# Patient Record
Sex: Female | Born: 1955 | Race: White | Hispanic: No | Marital: Married | Smoking: Never smoker
Health system: Southern US, Community
[De-identification: ages and names within clinical notes are randomized; demographics above are authoritative.]

## PROBLEM LIST (undated history)

## (undated) DIAGNOSIS — K5792 Diverticulitis of intestine, part unspecified, without perforation or abscess without bleeding: Secondary | ICD-10-CM

## (undated) DIAGNOSIS — I10 Essential (primary) hypertension: Secondary | ICD-10-CM

## (undated) DIAGNOSIS — T7840XA Allergy, unspecified, initial encounter: Secondary | ICD-10-CM

## (undated) DIAGNOSIS — F32A Depression, unspecified: Secondary | ICD-10-CM

## (undated) DIAGNOSIS — F4310 Post-traumatic stress disorder, unspecified: Secondary | ICD-10-CM

## (undated) HISTORY — DX: Post-traumatic stress disorder, unspecified: F43.10

## (undated) HISTORY — DX: Diverticulitis of intestine, part unspecified, without perforation or abscess without bleeding: K57.92

## (undated) HISTORY — DX: Depression, unspecified: F32.A

## (undated) HISTORY — DX: Allergy, unspecified, initial encounter: T78.40XA

## (undated) HISTORY — PX: PLACEMENT OF BREAST IMPLANTS: SHX6334

## (undated) HISTORY — PX: RHINOPLASTY: SUR1284

---

## 2008-04-19 DIAGNOSIS — E039 Hypothyroidism, unspecified: Secondary | ICD-10-CM | POA: Insufficient documentation

## 2011-11-11 ENCOUNTER — Other Ambulatory Visit (HOSPITAL_COMMUNITY): Payer: Self-pay | Admitting: Chiropractic Medicine

## 2011-11-11 ENCOUNTER — Ambulatory Visit (HOSPITAL_COMMUNITY)
Admission: RE | Admit: 2011-11-11 | Discharge: 2011-11-11 | Disposition: A | Discharge status: Home / Self Care | Payer: BC Managed Care – PPO | Source: Ambulatory Visit | Attending: Chiropractic Medicine | Admitting: Chiropractic Medicine

## 2011-11-11 DIAGNOSIS — M25559 Pain in unspecified hip: Secondary | ICD-10-CM

## 2012-10-03 DIAGNOSIS — Z7189 Other specified counseling: Secondary | ICD-10-CM | POA: Insufficient documentation

## 2016-09-15 DIAGNOSIS — R6889 Other general symptoms and signs: Secondary | ICD-10-CM | POA: Diagnosis not present

## 2016-09-15 DIAGNOSIS — R5381 Other malaise: Secondary | ICD-10-CM | POA: Diagnosis not present

## 2016-09-15 DIAGNOSIS — R05 Cough: Secondary | ICD-10-CM | POA: Diagnosis not present

## 2017-01-19 DIAGNOSIS — R109 Unspecified abdominal pain: Secondary | ICD-10-CM | POA: Diagnosis not present

## 2017-01-20 DIAGNOSIS — R1032 Left lower quadrant pain: Secondary | ICD-10-CM | POA: Diagnosis not present

## 2017-07-19 ENCOUNTER — Emergency Department (HOSPITAL_COMMUNITY): Payer: No Typology Code available for payment source

## 2017-07-19 ENCOUNTER — Emergency Department (HOSPITAL_COMMUNITY)
Admission: EM | Admit: 2017-07-19 | Discharge: 2017-07-19 | Disposition: A | Discharge status: Home / Self Care | Payer: No Typology Code available for payment source | Attending: Emergency Medicine | Admitting: Emergency Medicine

## 2017-07-19 ENCOUNTER — Other Ambulatory Visit: Payer: Self-pay

## 2017-07-19 DIAGNOSIS — Y939 Activity, unspecified: Secondary | ICD-10-CM | POA: Insufficient documentation

## 2017-07-19 DIAGNOSIS — M25511 Pain in right shoulder: Secondary | ICD-10-CM | POA: Diagnosis not present

## 2017-07-19 DIAGNOSIS — Y999 Unspecified external cause status: Secondary | ICD-10-CM | POA: Diagnosis not present

## 2017-07-19 DIAGNOSIS — R0602 Shortness of breath: Secondary | ICD-10-CM | POA: Insufficient documentation

## 2017-07-19 DIAGNOSIS — Y929 Unspecified place or not applicable: Secondary | ICD-10-CM | POA: Diagnosis not present

## 2017-07-19 DIAGNOSIS — Z041 Encounter for examination and observation following transport accident: Secondary | ICD-10-CM | POA: Diagnosis present

## 2017-07-19 DIAGNOSIS — R079 Chest pain, unspecified: Secondary | ICD-10-CM

## 2017-07-19 DIAGNOSIS — R0789 Other chest pain: Secondary | ICD-10-CM

## 2017-07-19 DIAGNOSIS — M542 Cervicalgia: Secondary | ICD-10-CM | POA: Diagnosis not present

## 2017-07-19 MED ORDER — FENTANYL CITRATE (PF) 100 MCG/2ML IJ SOLN
50.0000 ug | Freq: Once | INTRAMUSCULAR | Status: AC
  Administered 2017-07-19: 50 ug via INTRAVENOUS
  Filled 2017-07-19: qty 2

## 2017-07-19 MED ORDER — NAPROXEN 375 MG PO TABS: 375.0000 mg | ORAL_TABLET | Freq: Two times a day (BID) | ORAL | 0 refills | Status: DC

## 2017-07-19 MED ORDER — HYDROCODONE-ACETAMINOPHEN 5-325 MG PO TABS: 1.0000 | ORAL_TABLET | Freq: Four times a day (QID) | ORAL | 0 refills | Status: DC | PRN

## 2017-07-19 MED ORDER — HYDROCODONE-ACETAMINOPHEN 5-325 MG PO TABS
1.0000 | ORAL_TABLET | Freq: Once | ORAL | Status: AC
  Administered 2017-07-19: 1 via ORAL
  Filled 2017-07-19: qty 1

## 2017-07-19 NOTE — Discharge Instructions (Addendum)
Please take medications as directed for pain. Be sure to perform the deep breathing exercises at least every 2 hours. Use the incentive spirometer every hour while awake for the next 24 hours, then at least every two hours while awake as long as your pain is limiting your breathing.
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
## 2017-07-26 ENCOUNTER — Other Ambulatory Visit: Payer: Self-pay

## 2017-07-26 ENCOUNTER — Encounter (HOSPITAL_COMMUNITY): Payer: Self-pay | Admitting: Emergency Medicine

## 2017-07-26 ENCOUNTER — Ambulatory Visit (HOSPITAL_COMMUNITY)
Admission: EM | Admit: 2017-07-26 | Discharge: 2017-07-26 | Disposition: A | Discharge status: Home / Self Care | Payer: Self-pay | Attending: Internal Medicine | Admitting: Internal Medicine

## 2017-07-26 DIAGNOSIS — S161XXA Strain of muscle, fascia and tendon at neck level, initial encounter: Secondary | ICD-10-CM

## 2017-07-26 DIAGNOSIS — S161XXD Strain of muscle, fascia and tendon at neck level, subsequent encounter: Secondary | ICD-10-CM

## 2017-07-26 DIAGNOSIS — S20211D Contusion of right front wall of thorax, subsequent encounter: Secondary | ICD-10-CM

## 2017-07-26 DIAGNOSIS — S20211A Contusion of right front wall of thorax, initial encounter: Secondary | ICD-10-CM

## 2017-07-26 HISTORY — DX: Essential (primary) hypertension: I10

## 2017-07-26 NOTE — Discharge Instructions (Signed)
Continue with twice a day naproxen, take with food.  Range of motion exercises for neck and right shoulder.  Physical therapy office will contact you to set up appointment.  Continue with use of incentive spirometer, forced cough every hour.
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
## 2017-08-25 DIAGNOSIS — I10 Essential (primary) hypertension: Secondary | ICD-10-CM | POA: Diagnosis not present

## 2017-09-06 DIAGNOSIS — I10 Essential (primary) hypertension: Secondary | ICD-10-CM | POA: Insufficient documentation

## 2017-09-06 DIAGNOSIS — M8580 Other specified disorders of bone density and structure, unspecified site: Secondary | ICD-10-CM | POA: Insufficient documentation

## 2017-09-14 DIAGNOSIS — I10 Essential (primary) hypertension: Secondary | ICD-10-CM | POA: Diagnosis not present

## 2017-09-14 DIAGNOSIS — E039 Hypothyroidism, unspecified: Secondary | ICD-10-CM | POA: Diagnosis not present

## 2017-09-14 DIAGNOSIS — Z1231 Encounter for screening mammogram for malignant neoplasm of breast: Secondary | ICD-10-CM | POA: Diagnosis not present

## 2017-09-14 DIAGNOSIS — Z0000 Encounter for general adult medical examination without abnormal findings: Secondary | ICD-10-CM | POA: Diagnosis not present

## 2017-09-14 DIAGNOSIS — M8580 Other specified disorders of bone density and structure, unspecified site: Secondary | ICD-10-CM | POA: Diagnosis not present

## 2017-09-16 DIAGNOSIS — Z1231 Encounter for screening mammogram for malignant neoplasm of breast: Secondary | ICD-10-CM | POA: Diagnosis not present

## 2017-09-16 DIAGNOSIS — I10 Essential (primary) hypertension: Secondary | ICD-10-CM | POA: Diagnosis not present

## 2017-09-16 DIAGNOSIS — T8544XA Capsular contracture of breast implant, initial encounter: Secondary | ICD-10-CM | POA: Diagnosis not present

## 2017-09-16 DIAGNOSIS — Z0000 Encounter for general adult medical examination without abnormal findings: Secondary | ICD-10-CM | POA: Diagnosis not present

## 2017-09-16 DIAGNOSIS — L309 Dermatitis, unspecified: Secondary | ICD-10-CM | POA: Insufficient documentation

## 2017-09-16 DIAGNOSIS — E039 Hypothyroidism, unspecified: Secondary | ICD-10-CM | POA: Insufficient documentation

## 2017-09-16 DIAGNOSIS — Z1501 Genetic susceptibility to malignant neoplasm of breast: Secondary | ICD-10-CM | POA: Insufficient documentation

## 2017-09-16 DIAGNOSIS — Z041 Encounter for examination and observation following transport accident: Secondary | ICD-10-CM | POA: Diagnosis not present

## 2017-09-16 DIAGNOSIS — T8549XA Other mechanical complication of breast prosthesis and implant, initial encounter: Secondary | ICD-10-CM | POA: Diagnosis not present

## 2017-09-16 DIAGNOSIS — F439 Reaction to severe stress, unspecified: Secondary | ICD-10-CM | POA: Insufficient documentation

## 2017-09-27 DIAGNOSIS — H524 Presbyopia: Secondary | ICD-10-CM | POA: Diagnosis not present

## 2017-09-27 DIAGNOSIS — H11002 Unspecified pterygium of left eye: Secondary | ICD-10-CM | POA: Diagnosis not present

## 2017-10-17 DIAGNOSIS — H11002 Unspecified pterygium of left eye: Secondary | ICD-10-CM | POA: Diagnosis not present

## 2017-10-17 DIAGNOSIS — H04123 Dry eye syndrome of bilateral lacrimal glands: Secondary | ICD-10-CM | POA: Diagnosis not present

## 2017-10-20 DIAGNOSIS — R922 Inconclusive mammogram: Secondary | ICD-10-CM | POA: Diagnosis not present

## 2017-10-20 DIAGNOSIS — T8549XA Other mechanical complication of breast prosthesis and implant, initial encounter: Secondary | ICD-10-CM | POA: Diagnosis not present

## 2017-10-20 DIAGNOSIS — Z041 Encounter for examination and observation following transport accident: Secondary | ICD-10-CM | POA: Diagnosis not present

## 2017-10-20 DIAGNOSIS — T8544XA Capsular contracture of breast implant, initial encounter: Secondary | ICD-10-CM | POA: Diagnosis not present

## 2017-10-21 DIAGNOSIS — Z041 Encounter for examination and observation following transport accident: Secondary | ICD-10-CM | POA: Diagnosis not present

## 2017-10-21 DIAGNOSIS — T8549XA Other mechanical complication of breast prosthesis and implant, initial encounter: Secondary | ICD-10-CM | POA: Diagnosis not present

## 2018-01-03 ENCOUNTER — Ambulatory Visit (INDEPENDENT_AMBULATORY_CARE_PROVIDER_SITE_OTHER)
Admission: RE | Admit: 2018-01-03 | Discharge: 2018-01-03 | Disposition: A | Discharge status: Home / Self Care | Payer: Self-pay | Source: Ambulatory Visit | Attending: Internal Medicine | Admitting: Internal Medicine

## 2018-01-03 ENCOUNTER — Ambulatory Visit (INDEPENDENT_AMBULATORY_CARE_PROVIDER_SITE_OTHER): Payer: Self-pay | Admitting: Internal Medicine

## 2018-01-03 ENCOUNTER — Telehealth: Payer: Self-pay | Admitting: Internal Medicine

## 2018-01-03 ENCOUNTER — Encounter: Payer: Self-pay | Admitting: Internal Medicine

## 2018-01-03 VITALS — BP 118/78 | HR 67 | Ht 64.0 in | Wt 130.6 lb

## 2018-01-03 DIAGNOSIS — R062 Wheezing: Secondary | ICD-10-CM

## 2018-01-03 DIAGNOSIS — R0689 Other abnormalities of breathing: Secondary | ICD-10-CM

## 2018-01-03 DIAGNOSIS — Z87828 Personal history of other (healed) physical injury and trauma: Secondary | ICD-10-CM

## 2018-01-03 DIAGNOSIS — R0600 Dyspnea, unspecified: Secondary | ICD-10-CM

## 2018-01-03 LAB — NITRIC OXIDE: Nitric Oxide: 17
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
## 2018-01-03 NOTE — Progress Notes (Signed)
   Subjective:    Patient ID: Cassandra Garner, female    DOB: 1955/11/29, 62 y.o.   MRN: [id number]  HPI    Review of Systems  Constitutional: Positive for unexpected weight change. Negative for fever.  HENT: Negative for congestion, dental problem, ear pain, nosebleeds, postnasal drip, rhinorrhea, sinus pressure, sneezing, sore throat and trouble swallowing.   Eyes: Positive for redness and itching.  Respiratory: Positive for shortness of breath and wheezing. Negative for cough and chest tightness.   Cardiovascular: Positive for palpitations. Negative for leg swelling.  Gastrointestinal: Negative for nausea and vomiting.  Genitourinary: Negative for dysuria.  Musculoskeletal: Negative for joint swelling.  Skin: Negative for rash.  Allergic/Immunologic: Positive for environmental allergies. Negative for food allergies and immunocompromised state.  Neurological: Negative for headaches.  Hematological: Does not bruise/bleed easily.  Psychiatric/Behavioral: Positive for dysphoric mood. The patient is nervous/anxious.        Objective:   Physical Exam        Assessment & Plan:

## 2018-01-03 NOTE — Patient Instructions (Addendum)
ICD-10-CM   1. Dyspnea and respiratory abnormalities R06.00    R06.89   2. Wheezing R06.2   3. History of trauma of chest Z87.828     Significant musculoskeletal injury to chest resulting in problems  Plan  - in order to understand this further, assess severity and rule out other causes please do the following -- CXR 2 view 01/03/2018 - full PFT test at your convenience - refer [facility] [place] - closest to your house  Followup Return next few weeks to see me but after completing agove

## 2018-01-03 NOTE — Telephone Encounter (Signed)
I spoke at 17.30 to [facility] - small change in plan - she can continue to take the lisinopril - cxr looks normal to me - official report pending  - she needs to have PFT next 10 days and seem within 7-14 days - I have added new clinic slot  Thanks  Dr. Brand Males, M.D., [facility].C.P Pulmonary and Critical Care Medicine Staff Physician, [place] Director - Interstitial Lung Disease  Program  Pulmonary [place] at [place], [place], [address]  Pager: [phone], If no answer or between  15:00h - 7:00h: call [phone] Telephone: [phone]

## 2018-01-04 NOTE — Telephone Encounter (Signed)
Pt returning call about results CB [phone]

## 2018-01-04 NOTE — Telephone Encounter (Signed)
Report given and explained   Dr. Brand Males, M.D., [facility].C.P Pulmonary and Critical Care Medicine Staff Physician, [place] Director - Interstitial Lung Disease  Program  Pulmonary [place] at [place], [place], [address]  Pager: [phone], If no answer or between  15:00h - 7:00h: call [phone] Telephone: [phone]

## 2018-01-04 NOTE — Telephone Encounter (Signed)
Pt currently has an appt scheduled to have a PFT 6/26 followed by the OV with you after the PFT.  Just to clarify that we are needing to change pt's appt from her scheduled appt with you in June for her to be seen within 7-14 days also having a PFT.
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
## 2018-01-04 NOTE — Telephone Encounter (Signed)
lmtcb x1 for pt. 

## 2018-01-09 ENCOUNTER — Telehealth: Payer: Self-pay | Admitting: Internal Medicine

## 2018-01-09 NOTE — Telephone Encounter (Signed)
lmtcb x1 for pt. 

## 2018-01-09 NOTE — Telephone Encounter (Addendum)
In order for pt to have a PFT followed by the OV, there is no day that works out within the next two weeks with the PFT and OV on the same day.  I will call pt tomorrow, 5/22 to reschedule pt's appts.  Routing message to myself.

## 2018-01-09 NOTE — Telephone Encounter (Signed)
Dr. Chase Caller, does pt need to be seen sooner? Her appt is on 6/26. Do you have a held time for this visit.Please advise.    Cassandra Garner, Cassandra Garner, CMA     2:22 PM  Note    Pt currently has an appt scheduled to have a PFT 6/26 followed by the OV with you after the PFT.  Just to clarify that we are needing to change pt's appt from her scheduled appt with you in June for her to be seen within 7-14 days also having a PFT.

## 2018-01-09 NOTE — Telephone Encounter (Signed)
Yes to be seen sooner - preferrably next 2 weeks. Please work with Raquel Sarna. She might need a 30 min slot but can work with 15 min

## 2018-01-09 NOTE — Telephone Encounter (Signed)
Pt is calling back [phone]

## 2018-01-10 ENCOUNTER — Ambulatory Visit: Payer: BLUE CROSS/BLUE SHIELD | Admitting: Internal Medicine

## 2018-01-10 ENCOUNTER — Encounter: Payer: Self-pay | Admitting: Internal Medicine

## 2018-01-10 VITALS — BP 134/86 | HR 85 | Temp 98.6°F | Ht 64.0 in | Wt 130.0 lb

## 2018-01-10 DIAGNOSIS — I10 Essential (primary) hypertension: Secondary | ICD-10-CM | POA: Diagnosis not present

## 2018-01-10 DIAGNOSIS — F4310 Post-traumatic stress disorder, unspecified: Secondary | ICD-10-CM | POA: Insufficient documentation

## 2018-01-10 DIAGNOSIS — Z0000 Encounter for general adult medical examination without abnormal findings: Secondary | ICD-10-CM | POA: Diagnosis not present

## 2018-01-10 DIAGNOSIS — K5792 Diverticulitis of intestine, part unspecified, without perforation or abscess without bleeding: Secondary | ICD-10-CM

## 2018-01-10 HISTORY — DX: Diverticulitis of intestine, part unspecified, without perforation or abscess without bleeding: K57.92

## 2018-01-10 MED ORDER — LISINOPRIL 40 MG PO TABS: 40.0000 mg | ORAL_TABLET | Freq: Every day | ORAL | 3 refills | Status: DC
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
## 2018-01-10 NOTE — Patient Instructions (Addendum)
You will be contacted regarding the referral for: GYN  OK to decrease the lisinopril to 40 mg per day  Please continue to monitor your Blood Pressure on a regular basis, and call or Mychart in 1-2 wks for BP > 130/80   Please continue all other medications as before, and refills have been done if requested.  Please have the pharmacy call with any other refills you may need.  Please continue your efforts at being more active, low cholesterol diet, and weight control.  You are otherwise up to date with prevention measures today.  Please keep your appointments with your specialists as you may have planned  We can hold on labs today as they were done at the [facility]  Please return in 6 months, or sooner if needed

## 2018-01-10 NOTE — Assessment & Plan Note (Signed)
Borderline controlled, to decrease the lisinopril from 80 to 40 qd, consider add toprol xl 25 if needs further,  to f/u any worsening symptoms or concerns

## 2018-01-10 NOTE — Assessment & Plan Note (Signed)

## 2018-01-11 NOTE — Telephone Encounter (Signed)
Attempted to call pt but no answer.   Left message for pt to return call so that way we can see when we can reschedule the PFT and OV.  Pt might have to have PFT and OV on separate days in order for pt to be seen within the next two weeks with MR.

## 2018-01-11 NOTE — Telephone Encounter (Signed)
Spoke with the pt and scheduled appt for PFT on 01/12/18 at 4 pm and ov with MR 30 min slot 01/13/18 at 11 am

## 2018-01-11 NOTE — Telephone Encounter (Signed)
Pt is calling back [phone]

## 2018-01-12 ENCOUNTER — Ambulatory Visit: Payer: BLUE CROSS/BLUE SHIELD

## 2018-01-12 DIAGNOSIS — R0689 Other abnormalities of breathing: Secondary | ICD-10-CM

## 2018-01-12 DIAGNOSIS — R0600 Dyspnea, unspecified: Secondary | ICD-10-CM

## 2018-01-12 LAB — PULMONARY FUNCTION TEST
DL/VA % pred: 109 %
DL/VA: 5.37 ml/min/mmHg/L
DLCO UNC % PRED: 82 %
DLCO UNC: 21.16 ml/min/mmHg
FEF 25-75 Pre: 2.33 L/sec
FEF2575-%Pred-Pre: 97 %
FEV1-%Pred-Pre: 71 %
FEV1-Pre: 1.9 L
FEV1FVC-%PRED-PRE: 108 %
FEV6-%PRED-PRE: 68 %
FEV6-PRE: 2.25 L
FEV6FVC-%Pred-Pre: 104 %
FVC-%Pred-Pre: 65 %
FVC-Pre: 2.25 L
PRE FEV1/FVC RATIO: 85 %
PRE FEV6/FVC RATIO: 100 %
RV % PRED: 102 %
RV: 2.11 L
TLC % PRED: 87 %
TLC: 4.55 L

## 2018-01-13 ENCOUNTER — Ambulatory Visit (INDEPENDENT_AMBULATORY_CARE_PROVIDER_SITE_OTHER): Payer: BLUE CROSS/BLUE SHIELD | Admitting: Internal Medicine

## 2018-01-13 ENCOUNTER — Encounter: Payer: Self-pay | Admitting: Internal Medicine

## 2018-01-13 VITALS — BP 128/70 | HR 78 | Ht 65.0 in | Wt 132.8 lb

## 2018-01-13 DIAGNOSIS — R062 Wheezing: Secondary | ICD-10-CM | POA: Diagnosis not present

## 2018-01-13 DIAGNOSIS — S2241XD Multiple fractures of ribs, right side, subsequent encounter for fracture with routine healing: Secondary | ICD-10-CM

## 2018-01-13 DIAGNOSIS — R0600 Dyspnea, unspecified: Secondary | ICD-10-CM

## 2018-01-13 DIAGNOSIS — R942 Abnormal results of pulmonary function studies: Secondary | ICD-10-CM | POA: Diagnosis not present

## 2018-01-13 DIAGNOSIS — R0689 Other abnormalities of breathing: Secondary | ICD-10-CM | POA: Diagnosis not present

## 2018-01-13 DIAGNOSIS — Z87828 Personal history of other (healed) physical injury and trauma: Secondary | ICD-10-CM

## 2018-01-13 DIAGNOSIS — R253 Fasciculation: Secondary | ICD-10-CM

## 2018-01-13 NOTE — Patient Instructions (Addendum)
ICD-10-CM   1. History of trauma of chest Z87.828   2. Closed fracture of multiple ribs of right side with routine healing, subsequent encounter S22.41XD   3. Dyspnea and respiratory abnormalities R06.00    R06.89   4. Wheezing R06.2   5. Abnormal flow volume loop R94.2   6. Muscle twitching R25.3    History of trauma of chest Closed fracture of multiple ribs of right side with routine healing, subsequent encounter  - evidence of healed fractures  Right side on cxr - expectant followup  Dyspnea and respiratory abnormalities Wheezing Abnormal flow volume loop and PFT  - symptoms due to chest trauma and resultant muscolskeletal strain  - there might be vocal cord injury v vocal cord dysfunction following trauma contributing to symptoms - no evidence of asthma or lung parenchymal issues - please continue to exercise - can consider starting inspiratory muscle strength training - 10 times daily - buy device at own copst - refer ENT to eval vocal cords - depending on results can refer you to vocal cord re-training  MUSCLE TWITCHING  - refer Dr Wells Guiles Tat neurology at your specific request  Followup 6-8 weeks or sooner if needed
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
## 2018-01-17 ENCOUNTER — Encounter: Payer: Self-pay | Admitting: Neurology

## 2018-01-20 ENCOUNTER — Ambulatory Visit: Payer: BLUE CROSS/BLUE SHIELD | Admitting: Neurology

## 2018-01-20 ENCOUNTER — Encounter: Payer: Self-pay | Admitting: Neurology

## 2018-01-20 VITALS — BP 118/72 | HR 88 | Ht 64.0 in | Wt 131.0 lb

## 2018-01-20 DIAGNOSIS — R252 Cramp and spasm: Secondary | ICD-10-CM | POA: Diagnosis not present

## 2018-01-20 DIAGNOSIS — R259 Unspecified abnormal involuntary movements: Secondary | ICD-10-CM

## 2018-01-20 DIAGNOSIS — R258 Other abnormal involuntary movements: Secondary | ICD-10-CM | POA: Diagnosis not present
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
## 2018-01-20 NOTE — Patient Instructions (Addendum)
Check labs. Your provider has requested that you have labwork completed today. Please go to [employer] after leaving the office today. See attached map.  Encouraged to stay well-hydrated. You can also try 2-3 glasses of tonic water  Start magnesium oxide 400mg  daily

## 2018-01-23 LAB — COMPREHENSIVE METABOLIC PANEL
AG RATIO: 1.8 (calc) (ref 1.0–2.5)
ALBUMIN MSPROF: 4.2 g/dL (ref 3.6–5.1)
ALT: 15 U/L (ref 6–29)
AST: 18 U/L (ref 10–35)
Alkaline phosphatase (APISO): 58 U/L (ref 33–130)
BUN: 12 mg/dL (ref 7–25)
CHLORIDE: 105 mmol/L (ref 98–110)
CO2: 32 mmol/L (ref 20–32)
CREATININE: 0.91 mg/dL (ref 0.50–0.99)
Calcium: 9.7 mg/dL (ref 8.6–10.4)
GLOBULIN: 2.4 g/dL (ref 1.9–3.7)
GLUCOSE: 117 mg/dL — AB (ref 65–99)
Potassium: 4.2 mmol/L (ref 3.5–5.3)
SODIUM: 142 mmol/L (ref 135–146)
TOTAL PROTEIN: 6.6 g/dL (ref 6.1–8.1)
Total Bilirubin: 0.7 mg/dL (ref 0.2–1.2)

## 2018-01-23 LAB — VITAMIN B12: Vitamin B-12: 466 pg/mL (ref 200–1100)

## 2018-01-23 LAB — TSH: TSH: 2.29 mIU/L (ref 0.40–4.50)

## 2018-01-23 LAB — MAGNESIUM: Magnesium: 2.4 mg/dL (ref 1.5–2.5)

## 2018-01-23 LAB — PARATHYROID HORMONE, INTACT (NO CA): PTH: 36 pg/mL (ref 14–64)

## 2018-01-25 DIAGNOSIS — R0600 Dyspnea, unspecified: Secondary | ICD-10-CM | POA: Insufficient documentation

## 2018-01-25 DIAGNOSIS — R0689 Other abnormalities of breathing: Secondary | ICD-10-CM | POA: Diagnosis not present

## 2018-01-25 DIAGNOSIS — R942 Abnormal results of pulmonary function studies: Secondary | ICD-10-CM | POA: Insufficient documentation

## 2018-02-15 ENCOUNTER — Ambulatory Visit: Payer: Self-pay | Admitting: Internal Medicine

## 2018-02-16 DIAGNOSIS — H11042 Peripheral pterygium, stationary, left eye: Secondary | ICD-10-CM | POA: Diagnosis not present

## 2018-02-16 DIAGNOSIS — H11002 Unspecified pterygium of left eye: Secondary | ICD-10-CM | POA: Diagnosis not present

## 2018-02-22 ENCOUNTER — Encounter (INDEPENDENT_AMBULATORY_CARE_PROVIDER_SITE_OTHER): Payer: Self-pay | Admitting: Orthopedic Surgery

## 2018-02-22 ENCOUNTER — Ambulatory Visit (INDEPENDENT_AMBULATORY_CARE_PROVIDER_SITE_OTHER): Payer: BLUE CROSS/BLUE SHIELD | Admitting: Orthopedic Surgery

## 2018-02-22 DIAGNOSIS — S46011A Strain of muscle(s) and tendon(s) of the rotator cuff of right shoulder, initial encounter: Secondary | ICD-10-CM
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
## 2018-03-08 ENCOUNTER — Ambulatory Visit: Payer: BLUE CROSS/BLUE SHIELD | Admitting: Internal Medicine

## 2018-03-08 ENCOUNTER — Encounter: Payer: Self-pay | Admitting: Internal Medicine

## 2018-03-08 VITALS — BP 120/86 | HR 72 | Ht 65.0 in | Wt 130.8 lb

## 2018-03-08 DIAGNOSIS — Z87828 Personal history of other (healed) physical injury and trauma: Secondary | ICD-10-CM | POA: Diagnosis not present

## 2018-03-08 DIAGNOSIS — R0600 Dyspnea, unspecified: Secondary | ICD-10-CM

## 2018-03-08 DIAGNOSIS — R0689 Other abnormalities of breathing: Secondary | ICD-10-CM
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
## 2018-03-16 ENCOUNTER — Encounter: Payer: Self-pay | Admitting: Internal Medicine

## 2018-03-31 ENCOUNTER — Encounter: Payer: Self-pay | Admitting: Family

## 2018-03-31 ENCOUNTER — Ambulatory Visit: Payer: BLUE CROSS/BLUE SHIELD | Admitting: Family

## 2018-03-31 VITALS — BP 118/78 | HR 80 | Temp 98.1°F | Ht 65.0 in | Wt 130.1 lb

## 2018-03-31 DIAGNOSIS — L089 Local infection of the skin and subcutaneous tissue, unspecified: Secondary | ICD-10-CM

## 2018-03-31 MED ORDER — MUPIROCIN 2 % EX OINT: TOPICAL_OINTMENT | CUTANEOUS | 0 refills | Status: DC

## 2018-03-31 NOTE — Progress Notes (Signed)
  Cassandra Garner is a 62 y.o. female with the following history as recorded in [employer]:  Patient Active Problem List   Diagnosis Date Noted  . Diverticulitis 01/10/2018  . Preventative health care 01/10/2018  . Hypertension   . PTSD (post-traumatic stress disorder)   . Hypothyroidism 09/16/2017  . Eczema 09/16/2017  . Osteopenia 09/06/2017    Current Outpatient Medications  Medication Sig Dispense Refill  . desoximetasone (TOPICORT) 0.05 % cream Apply topically.    Marland Kitchen lisinopril (PRINIVIL,ZESTRIL) 40 MG tablet Take 1 tablet (40 mg total) by mouth daily. 90 tablet 3  . loteprednol (LOTEMAX) 0.5 % ophthalmic suspension Place 1 drop into the left eye 4 (four) times daily.    Marland Kitchen loteprednol (LOTEMAX) 0.5 % ophthalmic suspension   0  . mupirocin ointment (BACTROBAN) 2 % Apply to affected area tid 15 g 0   No current facility-administered medications for this visit.     Allergies: Amlodipine; Penicillins; and Sulfa antibiotics  Past Medical History:  Diagnosis Date  . Diverticulitis 01/10/2018  . Hypertension   . PTSD (post-traumatic stress disorder)     Past Surgical History:  Procedure Laterality Date  . PLACEMENT OF BREAST IMPLANTS     known ruptures    Family History  Problem Relation Age of Onset  . Parkinson's disease Mother   . Hypertension Father     Social History   Tobacco Use  . Smoking status: Never Smoker  . Smokeless tobacco: Never Used  Substance Use Topics  . Alcohol use: Yes    Comment: 1-2 glass wine per wk    Subjective:  Patient presents with concerns for possible ringworm or bacterial skin infection on right hand at base of thumb; noticed in past 2 days; worried because area seems to be getting larger even with using Neosporin. No swelling of the hand/ no fever/ no drainage;    Objective:  Vitals:   03/31/18 1503  BP: 118/78  Pulse: 80  Temp: 98.1 F (36.7 C)  TempSrc: Oral  SpO2: 97%  Weight: 130 lb 1.3 oz (59 kg)  Height: 5\' 5"  (1.651 m)     General: Well developed, well nourished, in no acute distress  Skin : Warm and dry. Small superficial laceration noted on right hand Head: Normocephalic and atraumatic  Lungs: Respirations unlabored; Musculoskeletal: No deformities; no active joint inflammation  Extremities: No edema, cyanosis, clubbing  Vessels: Symmetric bilaterally  Neurologic: Alert and oriented; speech intact; face symmetrical; moves all extremities well; CNII-XII intact without focal deficit   Assessment:  1. Skin infection     Plan:  Reassurance that not ringworm; will treat with Bactroban- apply tid to affected area; follow-up worse, no better.   No follow-ups on file.  No orders of the defined types were placed in this encounter.   Requested Prescriptions   Signed Prescriptions Disp Refills  . mupirocin ointment (BACTROBAN) 2 % 15 g 0    Sig: Apply to affected area tid

## 2018-05-05 ENCOUNTER — Encounter

## 2018-05-05 ENCOUNTER — Ambulatory Visit: Payer: BLUE CROSS/BLUE SHIELD | Admitting: Neurology

## 2018-05-18 ENCOUNTER — Telehealth: Payer: Self-pay | Admitting: Internal Medicine
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
## 2018-05-24 NOTE — Telephone Encounter (Signed)
Kathlee Nations please advise if anything further is needed with this message. Thanks!

## 2018-05-30 NOTE — Telephone Encounter (Signed)
Kathlee Nations please advise if this has been taken care of. Thanks.

## 2018-06-03 DIAGNOSIS — T1512XA Foreign body in conjunctival sac, left eye, initial encounter: Secondary | ICD-10-CM | POA: Diagnosis not present

## 2018-07-10 ENCOUNTER — Encounter: Payer: Self-pay | Admitting: Internal Medicine

## 2018-07-10 ENCOUNTER — Ambulatory Visit: Payer: BLUE CROSS/BLUE SHIELD | Admitting: Internal Medicine

## 2018-07-10 VITALS — BP 122/86 | HR 71 | Temp 97.6°F | Ht 65.0 in | Wt 134.0 lb

## 2018-07-10 DIAGNOSIS — E039 Hypothyroidism, unspecified: Secondary | ICD-10-CM

## 2018-07-10 DIAGNOSIS — F4310 Post-traumatic stress disorder, unspecified: Secondary | ICD-10-CM

## 2018-07-10 DIAGNOSIS — I10 Essential (primary) hypertension: Secondary | ICD-10-CM | POA: Diagnosis not present

## 2018-07-10 DIAGNOSIS — Z23 Encounter for immunization: Secondary | ICD-10-CM

## 2018-07-10 MED ORDER — HYDROCHLOROTHIAZIDE 12.5 MG PO CAPS: 12.5000 mg | ORAL_CAPSULE | Freq: Every day | ORAL | 3 refills | Status: DC

## 2018-07-10 NOTE — Assessment & Plan Note (Signed)
Mild uncontrolled, to add hct 12.5 qd, cont all other meds, wt control, low sodium

## 2018-07-10 NOTE — Patient Instructions (Addendum)
You had the Tdap tetanus shot today  Please take all new medication as prescribed - the very mild low dose fluid pill (HCT 12.5 mg per day)  Please continue to monitor your BP at home as the goal should be at least less than 140/90 (or 130/80 would be  Better)  Please continue all other medications as before, and refills have been done if requested.  Please have the pharmacy call with any other refills you may need.  Please keep your appointments with your specialists as you may have planned  Please return in 6 months, or sooner if needed, with Lab testing done 3-5 days before

## 2018-07-10 NOTE — Progress Notes (Signed)
Subjective:    Patient ID: Cassandra Garner, female    DOB: 11/03/1955, 62 y.o.   MRN: [id number]  HPI  Here to f/u; overall doing ok,  Pt denies chest pain, increasing sob or doe, wheezing, orthopnea, PND, increased LE swelling, palpitations, dizziness or syncope.  Pt denies new neurological symptoms such as new headache, or facial or extremity weakness or numbness.  Pt denies polydipsia, polyuria, or low sugar episode.  Pt states overall good compliance with meds, mostly trying to follow appropriate diet, with wt overall stable,  but little exercise however.  Plans to call for routine GYn exam soon. Due for Tdap.  BP at home often 140-150 despite regular exercise, along with some wt gain.  Denies hyper or hypo thyroid symptoms such as voice, skin or hair change.  Denies worsening depressive symptoms, suicidal ideation, or panic Past Medical History:  Diagnosis Date  . Diverticulitis 01/10/2018  . Hypertension   . PTSD (post-traumatic stress disorder)    Past Surgical History:  Procedure Laterality Date  . PLACEMENT OF BREAST IMPLANTS     known ruptures    reports that she has never smoked. She has never used smokeless tobacco. She reports that she drinks alcohol. She reports that she does not use drugs. family history includes Hypertension in her father; Parkinson's disease in her mother. Allergies  Allergen Reactions  . Amlodipine     Dizzy, palpitations  . Penicillins Other (See Comments)    Has patient had a PCN reaction causing immediate rash, facial/tongue/throat swelling, SOB or lightheadedness with hypotension: Unknown Has patient had a PCN reaction causing severe rash involving mucus membranes or skin necrosis: No Has patient had a PCN reaction that required hospitalization: No Has patient had a PCN reaction occurring within the last 10 years: No If all of the above answers are "NO", then may proceed with Cephalosporin use.   . Sulfa Antibiotics Rash   Current Outpatient  Medications on File Prior to Visit  Medication Sig Dispense Refill  . desoximetasone (TOPICORT) 0.05 % cream Apply topically.    Marland Kitchen lisinopril (PRINIVIL,ZESTRIL) 40 MG tablet Take 1 tablet (40 mg total) by mouth daily. 90 tablet 3  . loteprednol (LOTEMAX) 0.5 % ophthalmic suspension Place 1 drop into the left eye 4 (four) times daily.    . mupirocin ointment (BACTROBAN) 2 % Apply to affected area tid 15 g 0   No current facility-administered medications on file prior to visit.    Review of Systems  Constitutional: Negative for other unusual diaphoresis or sweats HENT: Negative for ear discharge or swelling Eyes: Negative for other worsening visual disturbances Respiratory: Negative for stridor or other swelling  Gastrointestinal: Negative for worsening distension or other blood Genitourinary: Negative for retention or other urinary change Musculoskeletal: Negative for other MSK pain or swelling Skin: Negative for color change or other new lesions Neurological: Negative for worsening tremors and other numbness  Psychiatric/Behavioral: Negative for worsening agitation or other fatigue All other system neg per pt    Objective:   Physical Exam BP 122/86   Pulse 71   Temp 97.6 F (36.4 C) (Oral)   Ht 5\' 5"  (1.651 m)   Wt 134 lb (60.8 kg)   SpO2 98%   BMI 22.30 kg/m  VS noted,  Constitutional: Pt appears in NAD HENT: Head: NCAT.  Right Ear: External ear normal.  Left Ear: External ear normal.  Eyes: . Pupils are equal, round, and reactive to light. Conjunctivae and EOM are normal Nose:  without d/c or deformity Neck: Neck supple. Gross normal ROM Cardiovascular: Normal rate and regular rhythm.   Pulmonary/Chest: Effort normal and breath sounds without rales or wheezing.  Neurological: Pt is alert. At baseline orientation, motor grossly intact Skin: Skin is warm. No rashes, other new lesions, no LE edema Psychiatric: Pt behavior is normal without agitation  No other exam  findings Lab Results  Component Value Date   GLUCOSE 117 (H) 01/20/2018   ALT 15 01/20/2018   AST 18 01/20/2018   NA 142 01/20/2018   K 4.2 01/20/2018   CL 105 01/20/2018   CREATININE 0.91 01/20/2018   BUN 12 01/20/2018   CO2 32 01/20/2018   TSH 2.29 01/20/2018      Assessment & Plan:

## 2018-07-10 NOTE — Assessment & Plan Note (Signed)
stable overall by history and exam, recent data reviewed with pt, and pt to continue medical treatment as before,  to f/u any worsening symptoms or concerns  

## 2018-07-13 ENCOUNTER — Ambulatory Visit: Payer: BLUE CROSS/BLUE SHIELD | Admitting: Internal Medicine

## 2018-07-28 ENCOUNTER — Ambulatory Visit: Payer: Self-pay

## 2018-07-28 NOTE — Telephone Encounter (Signed)
Pt has been informed and expressed understanding.  

## 2018-07-28 NOTE — Telephone Encounter (Signed)
Ok to stop the hct as she has, I will place on the "allergy" list, and to continue to monitor BP at home, and let us know if BP > 140/90 more often than no, as we could consider a different medication such as low dose amlodipine 2.5 mg per day

## 2018-07-28 NOTE — Addendum Note (Signed)
Addended by: Biagio Borg on: 07/28/2018 01:01 PM   Modules accepted: Orders

## 2018-07-28 NOTE — Telephone Encounter (Signed)
Pt. Reports she started having severe side effects immediately after taking her hydrochlorothiazide. See below. Would like to speak with Dr. Jenny Reichmann if at all possible. Please advise pt.  Answer Assessment - Initial Assessment Questions 1. SYMPTOMS: "Do you have any symptoms?"     As soon as she started the hydrochlorothiazide she started having a stomach ache,nausea and sharp pain in upper abdomen. 3 days after starting the medication she had severe muscle cramping in her feet, lower legs and thighs. Has stopped the medication. 2. SEVERITY: If symptoms are present, ask "Are they mild, moderate or severe?"     Severe per pt.  Protocols used: MEDICATION QUESTION CALL-A-AH

## 2018-08-11 ENCOUNTER — Ambulatory Visit: Payer: BLUE CROSS/BLUE SHIELD | Admitting: Internal Medicine

## 2018-08-11 ENCOUNTER — Encounter: Payer: Self-pay | Admitting: Internal Medicine

## 2018-08-11 VITALS — BP 124/74 | HR 81 | Temp 97.8°F | Ht 65.0 in | Wt 134.0 lb

## 2018-08-11 DIAGNOSIS — I10 Essential (primary) hypertension: Secondary | ICD-10-CM

## 2018-08-11 DIAGNOSIS — E039 Hypothyroidism, unspecified: Secondary | ICD-10-CM

## 2018-08-11 DIAGNOSIS — F4310 Post-traumatic stress disorder, unspecified: Secondary | ICD-10-CM | POA: Diagnosis not present

## 2018-08-11 MED ORDER — BENAZEPRIL HCL 40 MG PO TABS: 40.0000 mg | ORAL_TABLET | Freq: Every day | ORAL | 3 refills | Status: DC

## 2018-08-11 NOTE — Progress Notes (Signed)
Subjective:    Patient ID: Cassandra Garner, female    DOB: [birth year], 62 y.o.   MRN: [id number]  HPI  Here for BP review, apparently did not tolerate the HCT low dose due to abd cramps and LLE cramping so stopped taking, also has had signficant palpitations with amlodipine in past; BP at  Home has been mild high about 140 -160 by her machine ,  Pt currently denies chest pain, increased sob or doe, wheezing, orthopnea, PND, increased LE swelling, palpitations, dizziness or syncope.  Pt denies new neurological symptoms such as new headache, or facial or extremity weakness or numbness   Pt denies polydipsia, polyuria.   Past Medical History:  Diagnosis Date  . Diverticulitis 01/10/2018  . Hypertension   . PTSD (post-traumatic stress disorder)    Past Surgical History:  Procedure Laterality Date  . PLACEMENT OF BREAST IMPLANTS     known ruptures    reports that she has never smoked. She has never used smokeless tobacco. She reports current alcohol use. She reports that she does not use drugs. family history includes Hypertension in her father; Parkinson's disease in her mother. Allergies  Allergen Reactions  . Amlodipine     Dizzy, palpitations  . Penicillins Other (See Comments)    Has patient had a PCN reaction causing immediate rash, facial/tongue/throat swelling, SOB or lightheadedness with hypotension: Unknown Has patient had a PCN reaction causing severe rash involving mucus membranes or skin necrosis: No Has patient had a PCN reaction that required hospitalization: No Has patient had a PCN reaction occurring within the last 10 years: No If all of the above answers are "NO", then may proceed with Cephalosporin use.   . Sulfa Antibiotics Rash   Current Outpatient Medications on File Prior to Visit  Medication Sig Dispense Refill  . desoximetasone (TOPICORT) 0.05 % cream Apply topically.    Marland Kitchen loteprednol (LOTEMAX) 0.5 % ophthalmic suspension Place 1 drop into the left eye 4  (four) times daily.    . mupirocin ointment (BACTROBAN) 2 % Apply to affected area tid 15 g 0   No current facility-administered medications on file prior to visit.    Review of Systems  Constitutional: Negative for other unusual diaphoresis or sweats HENT: Negative for ear discharge or swelling Eyes: Negative for other worsening visual disturbances Respiratory: Negative for stridor or other swelling  Gastrointestinal: Negative for worsening distension or other blood Genitourinary: Negative for retention or other urinary change Musculoskeletal: Negative for other MSK pain or swelling Skin: Negative for color change or other new lesions Neurological: Negative for worsening tremors and other numbness  Psychiatric/Behavioral: Negative for worsening agitation or other fatigue All other system neg per pt    Objective:   Physical Exam BP 124/74   Pulse 81   Temp 97.8 F (36.6 C) (Oral)   Ht 5\' 5"  (1.651 m)   Wt 134 lb (60.8 kg)   SpO2 97%   BMI 22.30 kg/m  VS noted,  Constitutional: Pt appears in NAD HENT: Head: NCAT.  Right Ear: External ear normal.  Left Ear: External ear normal.  Eyes: . Pupils are equal, round, and reactive to light. Conjunctivae and EOM are normal Nose: without d/c or deformity Neck: Neck supple. Gross normal ROM Cardiovascular: Normal rate and regular rhythm.   Pulmonary/Chest: Effort normal and breath sounds without rales or wheezing.  Abd:  Soft, NT, ND, + BS, no organomegaly Neurological: Pt is alert. At baseline orientation, motor grossly intact Skin: Skin is warm.  No rashes, other new lesions, no LE edema Psychiatric: Pt behavior is normal without agitation , 2+ nervous No other exam findings Lab Results  Component Value Date   GLUCOSE 117 (H) 01/20/2018   ALT 15 01/20/2018   AST 18 01/20/2018   NA 142 01/20/2018   K 4.2 01/20/2018   CL 105 01/20/2018   CREATININE 0.91 01/20/2018   BUN 12 01/20/2018   CO2 32 01/20/2018   TSH 2.29 01/20/2018        Assessment & Plan:

## 2018-08-11 NOTE — Patient Instructions (Signed)
OK to change the lisinopril to benazepril 40 mg per day  Please continue all other medications as before, and refills have been done if requested.  Please have the pharmacy call with any other refills you may need.  Please continue your efforts at being more active, low cholesterol diet, and weight control  Please keep your appointments with your specialists as you may have planned

## 2018-08-12 NOTE — Assessment & Plan Note (Signed)
After long discussion and fear of adding any new class of med, will change the current lisinopril to benazepril 40 qd and she will follow closely at home, and f/u for any persistent bp > 140/90

## 2018-08-12 NOTE — Assessment & Plan Note (Signed)
With ongoing anxiety, o/w stable overall by history and exam, recent data reviewed with pt, and pt to continue medical treatment as before,  to f/u any worsening symptoms or concerns

## 2018-08-12 NOTE — Assessment & Plan Note (Signed)
D/w pt, doubt has any relation to current mild elevated BP, to cont low salt diet, regular exercise and wt control

## 2018-12-01 ENCOUNTER — Telehealth: Payer: Self-pay | Admitting: Internal Medicine

## 2018-12-01 ENCOUNTER — Other Ambulatory Visit: Payer: Self-pay

## 2018-12-01 ENCOUNTER — Encounter (HOSPITAL_COMMUNITY): Payer: Self-pay

## 2018-12-01 ENCOUNTER — Ambulatory Visit (HOSPITAL_COMMUNITY)
Admission: EM | Admit: 2018-12-01 | Discharge: 2018-12-01 | Disposition: A | Discharge status: Home / Self Care | Payer: BLUE CROSS/BLUE SHIELD

## 2018-12-01 ENCOUNTER — Ambulatory Visit: Payer: Self-pay

## 2018-12-01 DIAGNOSIS — R202 Paresthesia of skin: Secondary | ICD-10-CM | POA: Diagnosis not present
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
## 2018-12-01 NOTE — ED Triage Notes (Signed)
C/o right hand numbness for past week, radiates up the arm , states that hand turns yellow

## 2018-12-01 NOTE — Discharge Instructions (Signed)
May try altering your sleeping position to allow for better blood flow to the hand.  Keep hand warm as able.  Antiinflammatory may be helpful.  Follow up with your primary care provider if symptoms persist as may need further evaluation.  If any worsening of symptoms- constant numbness, pain, swelling- or otherwise worsening please return or go to the ER.

## 2018-12-01 NOTE — Telephone Encounter (Signed)
Pt called to say her right hand was numb.  She states she has had symptoms for about 2 weeks.  She states that it always when she wakes after sleeping.  Today when she got up she states that her hand stayed numb and discolored looking yellow for 30 minutes.  She states that she continuously has to massage her had to bring it back.  She states there is a squeezing sensation to the hand. She feels that the numbness travels up her arm at times.She denies injury.  She denies severe pain She states that when it get cold her finger tips go numb. It has always been a problem. Per protocol pt will go to UC to have her symptoms evaluated. Care advice read to patient. Patient verbalized understanding of all instructions.  Reason for Disposition . Weakness (i.e., loss of strength) of new onset in hand or fingers  (Exceptions: not truly weak, hand feels weak because of pain; weakness present > 2 weeks)  Answer Assessment - Initial Assessment Questions 1. SYMPTOM: "What is the main symptom you are concerned about?" (e.g., weakness, numbness)     Numbness to right hand and arm 2. ONSET: "When did this start?" (minutes, hours, days; while sleeping)     2 weeks ago but getting worse  3. LAST NORMAL: "When was the last time you were normal (no symptoms)?"    30 minutes 4. PATTERN "Does this come and go, or has it been constant since it started?"  "Is it present now?"    At 11am woke at 1440 today 5. CARDIAC SYMPTOMS: "Have you had any of the following symptoms: chest pain, difficulty breathing, palpitations?"     no 6. NEUROLOGIC SYMPTOMS: "Have you had any of the following symptoms: headache, dizziness, vision loss, double vision, changes in speech, unsteady on your feet?"     no 7. OTHER SYMPTOMS: "Do you have any other symptoms?"     no 8. PREGNANCY: "Is there any chance you are pregnant?" "When was your last menstrual period?"     No  Answer Assessment - Initial Assessment Questions 1. ONSET: "When did  the pain start?"     2weeks ago 2. LOCATION: "Where is the pain located?"     Right hand 3. PAIN: "How bad is the pain?" (Scale 1-10; or mild, moderate, severe)   - MILD (1-3): doesn't interfere with normal activities   - MODERATE (4-7): interferes with normal activities (e.g., work or school) or awakens from sleep   - SEVERE (8-10): excruciating pain, unable to use hand at all    7 4. WORK OR EXERCISE: "Has there been any recent work or exercise that involved this part of the body?"     No  5. CAUSE: "What do you think is causing the pain?"     unsure 6. AGGRAVATING FACTORS: "What makes the pain worse?" (e.g., using computer)     Stop massaging makes it worse and numbness gets worse 7. OTHER SYMPTOMS: "Do you have any other symptoms?" (e.g., neck pain, swelling, rash, numbness, fever)    No some welling 8. PREGNANCY: "Is there any chance you are pregnant?" "When was your last menstrual period?"   N/A  Protocols used: HAND AND WRIST PAIN-A-AH, NEUROLOGIC DEFICIT-A-AH

## 2018-12-01 NOTE — Telephone Encounter (Signed)
Copied from [facility] [id number]. Topic: Appointment Scheduling - Scheduling Inquiry for Clinic >> Dec 01, 2018  6:22 PM Blase Mess A wrote: CRM for notification. See Telephone encounter for: 12/01/18. Patient is call to schedule a virtual visit. Patient went to Urgent Care. Please advise CB- [phone]

## 2018-12-04 ENCOUNTER — Ambulatory Visit (INDEPENDENT_AMBULATORY_CARE_PROVIDER_SITE_OTHER): Payer: BLUE CROSS/BLUE SHIELD | Admitting: Internal Medicine

## 2018-12-04 ENCOUNTER — Other Ambulatory Visit: Payer: Self-pay

## 2018-12-04 ENCOUNTER — Encounter: Payer: Self-pay | Admitting: Internal Medicine

## 2018-12-04 DIAGNOSIS — R202 Paresthesia of skin: Secondary | ICD-10-CM

## 2018-12-04 NOTE — Telephone Encounter (Signed)
Noted. Please view previous phone encounter.

## 2018-12-04 NOTE — Progress Notes (Signed)
Patient ID: Cassandra Garner, female   DOB: December 08, 1955, 63 y.o.   MRN: [id number]  Virtual Visit via Video Note  I connected with Cassandra Garner on 12/04/18 at  2:00 PM EDT by a video enabled telemedicine application and verified that I am speaking with the correct person using two identifiers. Pt is at home, I am at the office, husband also present   I discussed the limitations of evaluation and management by telemedicine and the availability of in person appointments. The patient expressed understanding and agreed to proceed.  History of Present Illness: Here to c/o mild recurrent numbness to right hand, mostly middle and 4th fingers with slight yellowish color change without pain or weakness, and denies right wrist/elbow/neck pain.,  Has been recurrent almost daily in the past wk or so, always when waking up in the am, and figured out when her arm was straighter one night that she did not have the numbness the next morning.  One episode however lasted 45 min and she went to UC, states no specific tx or evaluation needed, advised to wear gloves for possible finger color change (raynauds) with colder weather.  Of note is that with self isolation during the pendemic that she has spent much more time and effort with gardening, for hours every day.  Denies fever, trauma, swelling, current color change, other focal neuro change or HA, or wt gain.  Most recent B12 and TSH were normal may 2019. Past Medical History:  Diagnosis Date  . Diverticulitis 01/10/2018  . Hypertension   . PTSD (post-traumatic stress disorder)    Past Surgical History:  Procedure Laterality Date  . PLACEMENT OF BREAST IMPLANTS     known ruptures    reports that she has never smoked. She has never used smokeless tobacco. She reports current alcohol use. She reports that she does not use drugs. family history includes Hypertension in her father; Parkinson's disease in her mother. Allergies  Allergen Reactions  .  Amlodipine     Dizzy, palpitations  . Penicillins Other (See Comments)    Has patient had a PCN reaction causing immediate rash, facial/tongue/throat swelling, SOB or lightheadedness with hypotension: Unknown Has patient had a PCN reaction causing severe rash involving mucus membranes or skin necrosis: No Has patient had a PCN reaction that required hospitalization: No Has patient had a PCN reaction occurring within the last 10 years: No If all of the above answers are "NO", then may proceed with Cephalosporin use.   . Sulfa Antibiotics Rash   Current Outpatient Medications on File Prior to Visit  Medication Sig Dispense Refill  . benazepril (LOTENSIN) 40 MG tablet Take 1 tablet (40 mg total) by mouth daily. 90 tablet 3  . desoximetasone (TOPICORT) 0.05 % cream Apply topically.    Marland Kitchen loteprednol (LOTEMAX) 0.5 % ophthalmic suspension Place 1 drop into the left eye 4 (four) times daily.    . mupirocin ointment (BACTROBAN) 2 % Apply to affected area tid 15 g 0   No current facility-administered medications on file prior to visit.     Observations/Objective: Alert, nervous, mentating well, cn 2-12 intact, motor - moves all 4s, appears to have good right hand grip strength, no rash or swelling noted  Assessment and Plan: See notes  Follow Up Instructions: See notes   I discussed the assessment and treatment plan with the patient. The patient was provided an opportunity to ask questions and all were answered. The patient agreed with the plan and demonstrated an understanding of  the instructions.   The patient was advised to call back or seek an in-person evaluation if the symptoms worsen or if the condition fails to improve as anticipated.   Cathlean Cower, MD

## 2018-12-04 NOTE — Assessment & Plan Note (Signed)
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

## 2018-12-04 NOTE — Telephone Encounter (Signed)
LVM for patient to call back to make appt  °

## 2018-12-04 NOTE — Patient Instructions (Signed)
OK to try the right wrist splint at night, and call if not improved  Please continue all other medications as before, and refills have been done if requested.  Please have the pharmacy call with any other refills you may need.  Please keep your appointments with your specialists as you may have planned

## 2018-12-04 NOTE — Telephone Encounter (Signed)
Noted  

## 2019-01-09 ENCOUNTER — Ambulatory Visit: Payer: BLUE CROSS/BLUE SHIELD | Admitting: Internal Medicine

## 2019-01-12 ENCOUNTER — Ambulatory Visit: Payer: BLUE CROSS/BLUE SHIELD | Admitting: Internal Medicine

## 2019-02-03 ENCOUNTER — Encounter (HOSPITAL_COMMUNITY): Payer: Self-pay | Admitting: Emergency Medicine

## 2019-02-03 ENCOUNTER — Emergency Department (HOSPITAL_COMMUNITY): Payer: BC Managed Care – PPO

## 2019-02-03 ENCOUNTER — Emergency Department (HOSPITAL_COMMUNITY)
Admission: EM | Admit: 2019-02-03 | Discharge: 2019-02-03 | Disposition: A | Discharge status: Home / Self Care | Payer: BC Managed Care – PPO | Attending: Emergency Medicine | Admitting: Emergency Medicine

## 2019-02-03 ENCOUNTER — Other Ambulatory Visit: Payer: Self-pay

## 2019-02-03 DIAGNOSIS — I10 Essential (primary) hypertension: Secondary | ICD-10-CM | POA: Diagnosis not present

## 2019-02-03 DIAGNOSIS — R930 Abnormal findings on diagnostic imaging of skull and head, not elsewhere classified: Secondary | ICD-10-CM | POA: Diagnosis not present

## 2019-02-03 DIAGNOSIS — E039 Hypothyroidism, unspecified: Secondary | ICD-10-CM | POA: Insufficient documentation

## 2019-02-03 DIAGNOSIS — Z79899 Other long term (current) drug therapy: Secondary | ICD-10-CM | POA: Diagnosis not present

## 2019-02-03 DIAGNOSIS — H538 Other visual disturbances: Secondary | ICD-10-CM | POA: Diagnosis not present

## 2019-02-03 DIAGNOSIS — H5789 Other specified disorders of eye and adnexa: Secondary | ICD-10-CM | POA: Diagnosis not present

## 2019-02-03 DIAGNOSIS — G459 Transient cerebral ischemic attack, unspecified: Secondary | ICD-10-CM | POA: Diagnosis not present

## 2019-02-03 LAB — URINALYSIS, ROUTINE W REFLEX MICROSCOPIC
Bilirubin Urine: NEGATIVE
Glucose, UA: NEGATIVE mg/dL
Hgb urine dipstick: NEGATIVE
Ketones, ur: NEGATIVE mg/dL
Leukocytes,Ua: NEGATIVE
Nitrite: NEGATIVE
Protein, ur: NEGATIVE mg/dL
Specific Gravity, Urine: 1.006 (ref 1.005–1.030)
pH: 6 (ref 5.0–8.0)

## 2019-02-03 LAB — COMPREHENSIVE METABOLIC PANEL
ALT: 18 U/L (ref 0–44)
AST: 21 U/L (ref 15–41)
Albumin: 4.4 g/dL (ref 3.5–5.0)
Alkaline Phosphatase: 61 U/L (ref 38–126)
Anion gap: 10 (ref 5–15)
BUN: 13 mg/dL (ref 8–23)
CO2: 24 mmol/L (ref 22–32)
Calcium: 9.1 mg/dL (ref 8.9–10.3)
Chloride: 107 mmol/L (ref 98–111)
Creatinine, Ser: 0.81 mg/dL (ref 0.44–1.00)
GFR calc Af Amer: >60 mL/min (ref >60)
GFR calc non Af Amer: >60 mL/min (ref >60)
Glucose, Bld: 99 mg/dL (ref 70–99)
Potassium: 3.6 mmol/L (ref 3.5–5.1)
Sodium: 141 mmol/L (ref 135–145)
Total Bilirubin: 0.4 mg/dL (ref 0.3–1.2)
Total Protein: 7.1 g/dL (ref 6.5–8.1)

## 2019-02-03 LAB — CBC
HCT: 44.8 % (ref 36.0–46.0)
Hemoglobin: 14.7 g/dL (ref 12.0–15.0)
MCH: 29.9 pg (ref 26.0–34.0)
MCHC: 32.8 g/dL (ref 30.0–36.0)
MCV: 91.1 fL (ref 80.0–100.0)
Platelets: 246 10*3/uL (ref 150–400)
RBC: 4.92 MIL/uL (ref 3.87–5.11)
RDW: 12.5 % (ref 11.5–15.5)
WBC: 5.1 10*3/uL (ref 4.0–10.5)
nRBC: 0 % (ref 0.0–0.2)

## 2019-02-03 LAB — RAPID URINE DRUG SCREEN, HOSP PERFORMED
Amphetamines: NOT DETECTED
Barbiturates: NOT DETECTED
Benzodiazepines: NOT DETECTED
Cocaine: NOT DETECTED
Opiates: NOT DETECTED
Tetrahydrocannabinol: NOT DETECTED

## 2019-02-03 MED ORDER — SODIUM CHLORIDE 0.9 % IV SOLN
Freq: Once | INTRAVENOUS | Status: AC
  Administered 2019-02-03: 14:00:00 via INTRAVENOUS

## 2019-02-03 NOTE — ED Triage Notes (Signed)
Pt reports that around 11 oclock she was having blurred oval shape. It was in both eyes when covered the other one up, then when driving a man was mowing and his face was blurry. Did have some head pressure pains when symptoms where going on. Symptoms started about 53minutes.

## 2019-02-03 NOTE — ED Notes (Signed)
[facility] has been notified of patient requiring MRI.

## 2019-02-03 NOTE — ED Notes (Signed)
Pt transported to MRI 

## 2019-02-03 NOTE — ED Provider Notes (Signed)
[facility] DEPT Provider Note   CSN: [id number] Arrival date & time: 02/03/19  1249    History   Chief Complaint Chief Complaint  Patient presents with  . Blurred Vision    HPI Cassandra Garner is a 63 y.o. female.     Pt presents to the ED today with blurry vision.  She said she finished a nice outdoor [place] class.  She got into her car and noticed a "floater" at 1100.  The pt said she closed 1 eye and could see it with the other and then closed the other eye and she could still see it.  She drove home and vision became worse.  This lasted about 15 minutes.  It is now "98% better."  The pt denies any other neurologic symptoms.     Past Medical History:  Diagnosis Date  . Diverticulitis 01/10/2018  . Hypertension   . PTSD (post-traumatic stress disorder)     Patient Active Problem List   Diagnosis Date Noted  . Right hand paresthesia 12/04/2018  . Diverticulitis 01/10/2018  . Preventative health care 01/10/2018  . Hypertension   . PTSD (post-traumatic stress disorder)   . Hypothyroidism 09/16/2017  . Eczema 09/16/2017  . Osteopenia 09/06/2017    Past Surgical History:  Procedure Laterality Date  . PLACEMENT OF BREAST IMPLANTS     known ruptures     OB History   No obstetric history on file.      Home Medications    Prior to Admission medications   Medication Sig Start Date End Date Taking? Authorizing Provider  benazepril (LOTENSIN) 40 MG tablet Take 1 tablet (40 mg total) by mouth daily. 08/11/18  Yes Biagio Borg, MD  cholecalciferol (VITAMIN D3) 25 MCG (1000 UT) tablet Take 1,000 Units by mouth daily.   Yes [provider]  co-enzyme Q-10 30 MG capsule Take 30 mg by mouth daily.   Yes [provider]  Multiple Vitamins-Minerals (MULTIVITAMIN ADULT) TABS Take 1 tablet by mouth daily.   Yes [provider]  Oregano 1500 MG CAPS Take 1 capsule by mouth daily.   Yes [provider]   vitamin B-12 (CYANOCOBALAMIN) 100 MCG tablet Take 100 mcg by mouth daily.   Yes [provider]  vitamin C (ASCORBIC ACID) 500 MG tablet Take 500 mg by mouth daily.   Yes [provider]  desoximetasone (TOPICORT) 0.05 % cream Apply 1 application topically 2 (two) times daily as needed (eczema).  04/09/16   [provider]  mupirocin ointment (BACTROBAN) 2 % Apply to affected area tid Patient not taking: Reported on 02/03/2019 03/31/18   Marrian Salvage, FNP    Family History Family History  Problem Relation Age of Onset  . Parkinson's disease Mother   . Hypertension Father     Social History Social History   Tobacco Use  . Smoking status: Never Smoker  . Smokeless tobacco: Never Used  Substance Use Topics  . Alcohol use: Yes    Comment: 1-2 glass wine per wk  . Drug use: Never     Allergies   Amlodipine, Penicillins, and Sulfa antibiotics   Review of Systems Review of Systems  Eyes: Positive for visual disturbance.  All other systems reviewed and are negative.    Physical Exam Updated Vital Signs BP (!) 156/94   Pulse 64   Temp 98 F (36.7 C) (Oral)   Resp 18   SpO2 100%   Physical Exam Vitals signs and  nursing note reviewed.  Constitutional:      Appearance: Normal appearance.  HENT:     Head: Normocephalic and atraumatic.     Right Ear: External ear normal.     Left Ear: External ear normal.     Nose: Nose normal.     Mouth/Throat:     Mouth: Mucous membranes are moist.     Pharynx: Oropharynx is clear.  Eyes:     General: Vision grossly intact. Gaze aligned appropriately.     Extraocular Movements: Extraocular movements intact.     Conjunctiva/sclera: Conjunctivae normal.     Pupils: Pupils are equal, round, and reactive to light.  Neck:     Musculoskeletal: Normal range of motion and neck supple.  Cardiovascular:     Rate and Rhythm: Normal rate and regular rhythm.     Pulses: Normal pulses.     Heart sounds:  Normal heart sounds.  Pulmonary:     Effort: Pulmonary effort is normal.     Breath sounds: Normal breath sounds.  Abdominal:     General: Abdomen is flat. Bowel sounds are normal.     Palpations: Abdomen is soft.  Musculoskeletal: Normal range of motion.  Skin:    General: Skin is warm.     Capillary Refill: Capillary refill takes less than 2 seconds.  Neurological:     General: No focal deficit present.     Mental Status: She is alert and oriented to person, place, and time.  Psychiatric:        Mood and Affect: Mood normal.        Behavior: Behavior normal.        Thought Content: Thought content normal.        Judgment: Judgment normal.      ED Treatments / Results  Labs (all labs ordered are listed, but only abnormal results are displayed) Labs Reviewed  URINALYSIS, ROUTINE W REFLEX MICROSCOPIC - Abnormal; Notable for the following components:      Result Value   Color, Urine STRAW (*)    All other components within normal limits  RAPID URINE DRUG SCREEN, HOSP PERFORMED  CBC  COMPREHENSIVE METABOLIC PANEL    EKG EKG Interpretation  Date/Time:  Saturday February 03 2019 13:31:06 EDT Ventricular Rate:  74 PR Interval:    QRS Duration: 90 QT Interval:  416 QTC Calculation: 462 R Axis:   70 Text Interpretation:  Sinus rhythm Confirmed by Isla Pence [phone]) on 02/03/2019 1:34:01 PM   Radiology Ct Head Wo Contrast  Result Date: 02/03/2019 CLINICAL DATA:  Blurred vision in both eyes starting today question TIA, history hypertension EXAM: CT HEAD WITHOUT CONTRAST TECHNIQUE: Contiguous axial images were obtained from the base of the skull through the vertex without intravenous contrast. COMPARISON:  None FINDINGS: Brain: Normal ventricular morphology. No midline shift or mass effect. Normal appearance of brain parenchyma. No intracranial hemorrhage, mass lesion, evidence of acute infarction, or extra-axial fluid collection. Vascular: No hyperdense vessels Skull: Intact  Sinuses/Orbits: Clear Other: N/A IMPRESSION: Normal exam. Electronically Signed   By: Lavonia Dana M.D.   On: 02/03/2019 14:51   Mr Brain Wo Contrast  Result Date: 02/03/2019 CLINICAL DATA:  TIA.  Abnormal exam.  Episode of abnormal vision. EXAM: MRI HEAD WITHOUT CONTRAST TECHNIQUE: Multiplanar, multiecho pulse sequences of the brain and surrounding structures were obtained without intravenous contrast. COMPARISON:  CT head without contrast 02/03/2019 FINDINGS: Brain: The diffusion-weighted images demonstrate no acute or subacute infarction. No acute hemorrhage or mass lesion  is present. No significant white matter lesions are present. The ventricles are of normal size. No significant extraaxial fluid collection is present. Vascular: Flow is present in the major intracranial arteries. Skull and upper cervical spine: The craniocervical junction is normal. Upper cervical spine is within normal limits. Marrow signal is unremarkable. Sinuses/Orbits: The paranasal sinuses and mastoid air cells are clear. The globes and orbits are within normal limits. IMPRESSION: Normal MRI appearance of the brain for age. Electronically Signed   By: San Morelle M.D.   On: 02/03/2019 18:24    Procedures Procedures (including critical care time)  Medications Ordered in ED Medications  0.9 %  sodium chloride infusion ( Intravenous New Bag/Given 02/03/19 1339)     Initial Impression / Assessment and Plan / ED Course  I have reviewed the triage vital signs and the nursing notes.  Pertinent labs & imaging results that were available during my care of the patient were reviewed by me and considered in my medical decision making (see chart for details).         Visual Acuity  Right Eye Distance: 20/40 Left Eye Distance: 20/25 Bilateral Distance: 20/25  Right Eye Near:   Left Eye Near:    Bilateral Near:     Pt's work up negative.  Sx have not returned while here.  Pt is instructed to f/u with her pcp and  her eye doctor.  Return if worse.  Final Clinical Impressions(s) / ED Diagnoses   Final diagnoses:  Blurred vision    ED Discharge Orders    None       Isla Pence, MD 02/03/19 1831

## 2019-02-05 ENCOUNTER — Ambulatory Visit (INDEPENDENT_AMBULATORY_CARE_PROVIDER_SITE_OTHER): Payer: BC Managed Care – PPO | Admitting: Internal Medicine

## 2019-02-05 ENCOUNTER — Encounter: Payer: Self-pay | Admitting: Internal Medicine

## 2019-02-05 ENCOUNTER — Other Ambulatory Visit: Payer: Self-pay

## 2019-02-05 DIAGNOSIS — H53453 Other localized visual field defect, bilateral: Secondary | ICD-10-CM

## 2019-02-05 DIAGNOSIS — E039 Hypothyroidism, unspecified: Secondary | ICD-10-CM | POA: Diagnosis not present

## 2019-02-05 DIAGNOSIS — I10 Essential (primary) hypertension: Secondary | ICD-10-CM

## 2019-02-05 MED ORDER — ASPIRIN EC 81 MG PO TBEC: 81.0000 mg | DELAYED_RELEASE_TABLET | Freq: Every day | ORAL | 3 refills | Status: DC

## 2019-02-05 NOTE — Assessment & Plan Note (Signed)
Encouraged to continue check BP athome and next visit

## 2019-02-05 NOTE — Patient Instructions (Signed)
Ok to start asa 81 mg - 1 per day  It would seem you may have had a "scotoma" vs other cause of transient left upper bilateral visual field loss  Please continue all other medications as before, and refills have been done if requested.  Please have the pharmacy call with any other refills you may need.  Please continue your efforts at being more active, low cholesterol diet, and weight control.  Please keep your appointments with your specialists as you may have planned  You will be contacted regarding the referral for: Neurology

## 2019-02-05 NOTE — Assessment & Plan Note (Signed)
Due for TFT, declines further lab for now, .stable overall by history and exam, recent data reviewed with pt, and pt to continue medical treatment as before,  to f/u any worsening symptoms or concerns

## 2019-02-05 NOTE — Assessment & Plan Note (Signed)
By hx c/w what sounds like transient homonymous bilateral left upper visual field loss vs scotoma without HA or other focal neuro changes or symptoms;  D/w pt that TIA is not ruled out by normal cns imaging; ok for asa 81 qd for now, and refer Neurology for further consideration

## 2019-02-05 NOTE — Progress Notes (Signed)
Patient ID: Cassandra Garner, female   DOB: [birth year], 63 y.o.   MRN: [id number]  Virtual Visit via Video Note  I connected with Cassandra Garner on 02/05/19 at  7:20 PM EDT by a video enabled telemedicine application and verified that I am speaking with the correct person using two identifiers.  Location: Patient: at home Provider: at home   I discussed the limitations of evaluation and management by telemedicine and the availability of in person appointments. The patient expressed understanding and agreed to proceed.  History of Present Illness: Here to f/u recent ED visit where she mentions a transient large "floater" but actually describes what sounds like a transient homonymous bilateral left upper visual field loss,with blurry edges and little triangles at the edges, lasting about 30 min  This is described as like a hole in the left upper eye fields bilaterally such that covering one eye then the other did not resolve the partial visual field loss.  Pt states has been under significant stressors, some related to the pandemic and finances are tight.   Denied HA, but by deductive reasoning she believed given the above that the problem was not her eyes but maybe brain related such as TIA or stroke.  Was seen in ED 6/13 with neg CT brain and MRI brain for acute.  Pt was asked to f/u with PCP and ophthalmology.  Pt denies new neurological symptoms such as new headache, or facial or extremity weakness or numbness   Pt denies polydipsia, polyuria.  Pt denies chest pain, increased sob or doe, wheezing, orthopnea, PND, increased LE swelling, palpitations, dizziness or syncope. Past Medical History:  Diagnosis Date  . Diverticulitis 01/10/2018  . Hypertension   . PTSD (post-traumatic stress disorder)    Past Surgical History:  Procedure Laterality Date  . PLACEMENT OF BREAST IMPLANTS     known ruptures    reports that she has never smoked. She has never used smokeless tobacco. She reports  current alcohol use. She reports that she does not use drugs. family history includes Hypertension in her father; Parkinson's disease in her mother. Allergies  Allergen Reactions  . Amlodipine     Dizzy, palpitations  . Penicillins Other (See Comments)    Has patient had a PCN reaction causing immediate rash, facial/tongue/throat swelling, SOB or lightheadedness with hypotension: Unknown Has patient had a PCN reaction causing severe rash involving mucus membranes or skin necrosis: No Has patient had a PCN reaction that required hospitalization: No Has patient had a PCN reaction occurring within the last 10 years: No If all of the above answers are "NO", then may proceed with Cephalosporin use.   . Sulfa Antibiotics Rash   Current Outpatient Medications on File Prior to Visit  Medication Sig Dispense Refill  . benazepril (LOTENSIN) 40 MG tablet Take 1 tablet (40 mg total) by mouth daily. 90 tablet 3  . cholecalciferol (VITAMIN D3) 25 MCG (1000 UT) tablet Take 1,000 Units by mouth daily.    Marland Kitchen co-enzyme Q-10 30 MG capsule Take 30 mg by mouth daily.    Marland Kitchen desoximetasone (TOPICORT) 0.05 % cream Apply 1 application topically 2 (two) times daily as needed (eczema).     . Multiple Vitamins-Minerals (MULTIVITAMIN ADULT) TABS Take 1 tablet by mouth daily.    . mupirocin ointment (BACTROBAN) 2 % Apply to affected area tid (Patient not taking: Reported on 02/03/2019) 15 g 0  . Oregano 1500 MG CAPS Take 1 capsule by mouth daily.    . vitamin B-12 (  CYANOCOBALAMIN) 100 MCG tablet Take 100 mcg by mouth daily.    . vitamin C (ASCORBIC ACID) 500 MG tablet Take 500 mg by mouth daily.     No current facility-administered medications on file prior to visit.     Observations/Objective: Alert, NAD, nervous stressed mood and affect, resps normal, cn 2-12 intact, moves all 4s, no visible rash or swelling Lab Results  Component Value Date   WBC 5.1 02/03/2019   HGB 14.7 02/03/2019   HCT 44.8 02/03/2019   PLT  246 02/03/2019   GLUCOSE 99 02/03/2019   ALT 18 02/03/2019   AST 21 02/03/2019   NA 141 02/03/2019   K 3.6 02/03/2019   CL 107 02/03/2019   CREATININE 0.81 02/03/2019   BUN 13 02/03/2019   CO2 24 02/03/2019   TSH 2.29 01/20/2018   Assessment and Plan: See notes  Follow Up Instructions: See notes   I discussed the assessment and treatment plan with the patient. The patient was provided an opportunity to ask questions and all were answered. The patient agreed with the plan and demonstrated an understanding of the instructions.   The patient was advised to call back or seek an in-person evaluation if the symptoms worsen or if the condition fails to improve as anticipated.   Cathlean Cower, MD

## 2019-02-14 ENCOUNTER — Encounter: Payer: Self-pay | Admitting: Neurology

## 2019-02-14 ENCOUNTER — Other Ambulatory Visit: Payer: Self-pay

## 2019-02-14 ENCOUNTER — Telehealth: Payer: BC Managed Care – PPO | Admitting: Neurology

## 2019-02-14 ENCOUNTER — Telehealth (INDEPENDENT_AMBULATORY_CARE_PROVIDER_SITE_OTHER): Payer: BC Managed Care – PPO | Admitting: Neurology

## 2019-02-14 VITALS — Ht 65.0 in | Wt 130.0 lb

## 2019-02-14 DIAGNOSIS — H539 Unspecified visual disturbance: Secondary | ICD-10-CM

## 2019-02-14 DIAGNOSIS — G43109 Migraine with aura, not intractable, without status migrainosus: Secondary | ICD-10-CM

## 2019-02-14 DIAGNOSIS — R252 Cramp and spasm: Secondary | ICD-10-CM

## 2019-02-14 DIAGNOSIS — R259 Unspecified abnormal involuntary movements: Secondary | ICD-10-CM

## 2019-02-14 NOTE — Progress Notes (Signed)
° °  Virtual Visit via Video Note The purpose of this virtual visit is to provide medical care while limiting exposure to the novel coronavirus.    Consent was obtained for video visit:  Yes.   Answered questions that patient had about telehealth interaction:  Yes.   I discussed the limitations, risks, security and privacy concerns of performing an evaluation and management service by telemedicine. I also discussed with the patient that there may be a patient responsible charge related to this service. The patient expressed understanding and agreed to proceed.  Pt location: Home Physician Location: office Name of referring provider:  Biagio Borg, MD I connected with Cassandra Garner at patients initiation/request on 02/14/2019 at  9:30 AM EDT by video enabled telemedicine application and verified that I am speaking with the correct person using two identifiers. Pt MRN:  [id number] Pt DOB:  Aug 25, 1955 Video Participants:  Cassandra Garner   History of Present Illness: This is a 63 y.o. female returning for follow-up of visual disturbance.  On 6/13, she reports having an episode of transient visual change, described as a oval area in the left upper quadrant surrounded by bright zigzag lines with blurred center.  She tried to close each eye and symptoms were still present.  There was no loss of vision.  this lasted about 15 to 20 minutes and self resolved.  Prior to symptom onset, she was doing a outdoor tai chi class and when she sat in her car, noticed the symptoms.  She was evaluated in the emergency department due to concern of TIA and had MRI brain which was normal.  Two weeks later, she had similar symptoms, lasting 4 minutes while mowing her lawn.  She did not have any associated headache, double vision, facial weakness, or lateralizing numbness or tingling.  She has no family or personal history of migraines.  Mother had TIAs very late in life, she lived into her 73s.  Father also died at the  age of 54.   Observations/Objective:   Vitals:   02/14/19 0923  Weight: 130 lb (59 kg)  Height: '5\' 5"'$  (1.651 m)   Patient is awake, alert, and appears comfortable.  Oriented x 4.   Extraocular muscles are intact. No ptosis.  Face is symmetric.  Speech is not dysarthric. Tongue is midline. Antigravity in all extremities.  No pronator drift. Gait appears normal.  Stressed and tandem gait intact.  MRI brain 02/03/2019:  Normal MRI appearance of the brain for age.   Assessment and Plan:  Ocular migraine with scintillating scotomas, symptoms do not suggest TIA.  MRI brain was personally reviewed and appears normal.  I will check ultrasound carotids to be be complete.  She may remain off of aspirin unless there is evidence of hemodynamically significant stenosis on ultrasound.  Driving precautions were discussed.   Follow Up Instructions:   I discussed the assessment and treatment plan with the patient. The patient was provided an opportunity to ask questions and all were answered. The patient agreed with the plan and demonstrated an understanding of the instructions.   The patient was advised to call back or seek an in-person evaluation if the symptoms worsen or if the condition fails to improve as anticipated.  Follow-up as needed  Total time spent:  25 minutes     Alda Berthold, DO

## 2019-03-09 ENCOUNTER — Other Ambulatory Visit: Payer: Self-pay | Admitting: Internal Medicine

## 2019-03-09 ENCOUNTER — Telehealth: Payer: Self-pay | Admitting: Internal Medicine

## 2019-03-09 DIAGNOSIS — Z20828 Contact with and (suspected) exposure to other viral communicable diseases: Secondary | ICD-10-CM

## 2019-03-09 DIAGNOSIS — Z20822 Contact with and (suspected) exposure to covid-19: Secondary | ICD-10-CM

## 2019-03-09 NOTE — Telephone Encounter (Signed)
Copied from [place] [id number]. Topic: General - Other >> Mar 09, 2019  1:37 PM Pauline Good wrote: Reason for CRM: pt want to have a covid test due to exposure

## 2019-03-09 NOTE — Telephone Encounter (Signed)
OK for this - call pt to go to the [facility]

## 2019-03-09 NOTE — Telephone Encounter (Signed)
Pt informed of below. She states she was already tested today.

## 2019-03-11 LAB — NOVEL CORONAVIRUS, NAA: SARS-CoV-2, NAA: NOT DETECTED

## 2019-03-12 ENCOUNTER — Telehealth: Payer: Self-pay

## 2019-03-12 NOTE — Telephone Encounter (Signed)
Pt has viewed results via MyChart  

## 2019-03-12 NOTE — Telephone Encounter (Signed)
-----   Message from Biagio Borg, MD sent at 03/11/2019  3:36 PM EDT ----- Left message on MyChart, pt to cont same tx  Paityn Balsam to let pt know - COVID neg

## 2019-04-02 ENCOUNTER — Other Ambulatory Visit: Payer: Self-pay

## 2019-04-02 ENCOUNTER — Ambulatory Visit (INDEPENDENT_AMBULATORY_CARE_PROVIDER_SITE_OTHER): Payer: BC Managed Care – PPO | Admitting: Women's Health

## 2019-04-02 ENCOUNTER — Encounter: Payer: Self-pay | Admitting: Women's Health

## 2019-04-02 VITALS — BP 118/72 | Ht 63.0 in | Wt 133.0 lb

## 2019-04-02 DIAGNOSIS — Z01419 Encounter for gynecological examination (general) (routine) without abnormal findings: Secondary | ICD-10-CM | POA: Diagnosis not present

## 2019-04-02 DIAGNOSIS — Z13820 Encounter for screening for osteoporosis: Secondary | ICD-10-CM

## 2019-04-02 DIAGNOSIS — Z1151 Encounter for screening for human papillomavirus (HPV): Secondary | ICD-10-CM

## 2019-04-02 NOTE — Patient Instructions (Addendum)
shingrex vaccine  Shingles vaccine  Vit D 2000 iu  Daily  Dr Jeoffrey Massed  Plastic surgeon  Health Maintenance for Postmenopausal Women Menopause is a normal process in which your ability to get pregnant comes to an end. This process happens slowly over many months or years, usually between the ages of 65 and 38. Menopause is complete when you have missed your menstrual periods for 12 months. It is important to talk with your health care provider about some of the most common conditions that affect women after menopause (postmenopausal women). These include heart disease, cancer, and bone loss (osteoporosis). Adopting a healthy lifestyle and getting preventive care can help to promote your health and wellness. The actions you take can also lower your chances of developing some of these common conditions. What should I know about menopause? During menopause, you may get a number of symptoms, such as:  Hot flashes. These can be moderate or severe.  Night sweats.  Decrease in sex drive.  Mood swings.  Headaches.  Tiredness.  Irritability.  Memory problems.  Insomnia. Choosing to treat or not to treat these symptoms is a decision that you make with your health care provider. Do I need hormone replacement therapy?  Hormone replacement therapy is effective in treating symptoms that are caused by menopause, such as hot flashes and night sweats.  Hormone replacement carries certain risks, especially as you become older. If you are thinking about using estrogen or estrogen with progestin, discuss the benefits and risks with your health care provider. What is my risk for heart disease and stroke? The risk of heart disease, heart attack, and stroke increases as you age. One of the causes may be a change in the body's hormones during menopause. This can affect how your body uses dietary fats, triglycerides, and cholesterol. Heart attack and stroke are medical emergencies. There are many things  that you can do to help prevent heart disease and stroke. Watch your blood pressure  High blood pressure causes heart disease and increases the risk of stroke. This is more likely to develop in people who have high blood pressure readings, are of African descent, or are overweight.  Have your blood pressure checked: ? Every 3-5 years if you are 16-77 years of age. ? Every year if you are 47 years old or older. Eat a healthy diet   Eat a diet that includes plenty of vegetables, fruits, low-fat dairy products, and lean protein.  Do not eat a lot of foods that are high in solid fats, added sugars, or sodium. Get regular exercise Get regular exercise. This is one of the most important things you can do for your health. Most adults should:  Try to exercise for at least 150 minutes each week. The exercise should increase your heart rate and make you sweat (moderate-intensity exercise).  Try to do strengthening exercises at least twice each week. Do these in addition to the moderate-intensity exercise.  Spend less time sitting. Even light physical activity can be beneficial. Other tips  Work with your health care provider to achieve or maintain a healthy weight.  Do not use any products that contain nicotine or tobacco, such as cigarettes, e-cigarettes, and chewing tobacco. If you need help quitting, ask your health care provider.  Know your numbers. Ask your health care provider to check your cholesterol and your blood sugar (glucose). Continue to have your blood tested as directed by your health care provider. Do I need screening for cancer? Depending on your  health history and family history, you may need to have cancer screening at different stages of your life. This may include screening for:  Breast cancer.  Cervical cancer.  Lung cancer.  Colorectal cancer. What is my risk for osteoporosis? After menopause, you may be at increased risk for osteoporosis. Osteoporosis is a  condition in which bone destruction happens more quickly than new bone creation. To help prevent osteoporosis or the bone fractures that can happen because of osteoporosis, you may take the following actions:  If you are 76-32 years old, get at least 1,000 mg of calcium and at least 600 mg of vitamin D per day.  If you are older than age 2 but younger than age 63, get at least 1,200 mg of calcium and at least 600 mg of vitamin D per day.  If you are older than age 97, get at least 1,200 mg of calcium and at least 800 mg of vitamin D per day. Smoking and drinking excessive alcohol increase the risk of osteoporosis. Eat foods that are rich in calcium and vitamin D, and do weight-bearing exercises several times each week as directed by your health care provider. How does menopause affect my mental health? Depression may occur at any age, but it is more common as you become older. Common symptoms of depression include:  Low or sad mood.  Changes in sleep patterns.  Changes in appetite or eating patterns.  Feeling an overall lack of motivation or enjoyment of activities that you previously enjoyed.  Frequent crying spells. Talk with your health care provider if you think that you are experiencing depression. General instructions See your health care provider for regular wellness exams and vaccines. This may include:  Scheduling regular health, dental, and eye exams.  Getting and maintaining your vaccines. These include: ? Influenza vaccine. Get this vaccine each year before the flu season begins. ? Pneumonia vaccine. ? Shingles vaccine. ? Tetanus, diphtheria, and pertussis (Tdap) booster vaccine. Your health care provider may also recommend other immunizations. Tell your health care provider if you have ever been abused or do not feel safe at home. Summary  Menopause is a normal process in which your ability to get pregnant comes to an end.  This condition causes hot flashes, night  sweats, decreased interest in sex, mood swings, headaches, or lack of sleep.  Treatment for this condition may include hormone replacement therapy.  Take actions to keep yourself healthy, including exercising regularly, eating a healthy diet, watching your weight, and checking your blood pressure and blood sugar levels.  Get screened for cancer and depression. Make sure that you are up to date with all your vaccines. This information is not intended to replace advice given to you by your health care provider. Make sure you discuss any questions you have with your health care provider. Document Released: 10/01/2005 Document Revised: 08/02/2018 Document Reviewed: 08/02/2018 Elsevier Patient Education  2020 [employer].

## 2019-04-02 NOTE — Progress Notes (Signed)
Cassandra Garner [birth year] [id number]    History:    Presents for annual exam.  New patient.  Postmenopausal on no HRT.  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  Same partner.  Mammogram bilateral ruptured silicone implants ,  states has had [facility] in [place].  history of hypertension,  primary care manages.  01/2019 had an ocular migraine with a normal MRI.  Not sexually active in greater than 2 years, mutual decision.  History of infertility.  Diverticulosis  noted on colonoscopy 2 years ago.  Past medical history, past surgical history, family history and social history were all reviewed and documented in the EPIC chart.  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  Parents deceased, still has their home in [place].  History of hypothyroidism on no medication normal TSH.  Yoga teacher and Geologist, engineering.  ROS:  A ROS was performed and pertinent positives and negatives are included.  Exam:  Vitals:   04/02/19 1407  BP: 118/72  Weight: 133 lb (60.3 kg)  Height: _0  (1.6 m)   Body mass index is 23.56 kg/m.   General appearance:  Normal Thyroid:  Symmetrical, normal in size, without palpable masses or nodularity. Respiratory  Auscultation:  Clear without wheezing or rhonchi Cardiovascular  Auscultation:  Regular rate, without rubs, murmurs or gallops  Edema/varicosities:  Not grossly evident Abdominal  Soft,nontender, without masses, guarding or rebound.  Liver/spleen:  No organomegaly noted  Hernia:  None appreciated  Skin  Inspection:  Grossly normal   Breasts: Examined lying and sitting.     Right: Without masses, retractions, discharge or axillary adenopathy.     Left: Without masses, retractions, discharge or axillary adenopathy. Gentitourinary   Inguinal/mons:  Normal without inguinal adenopathy  External genitalia:   Normal  BUS/Urethra/Skene's glands:  Normal  Vagina: Atrophic    Cervix: Stenotic  Uterus:  normal in size, shape and contour.  Midline and mobile  Adnexa/parametria:     Rt: Without masses or tenderness.   Lt: Without masses or tenderness.  Anus and perineum: Normal  Digital rectal exam: Normal sphincter tone without palpated masses or tenderness  Assessment/Plan:  63 y.o. MWF G12 P0 for annual exam.     Adenocarcinoma of cervix LEEP cone greater than 20 years ago normal Paps after Ruptured bilateral silicone implants Situational stress Hypertension-primary care manages labs and meds  Plan: Reviewed importance of annual screening Pap, Pap with HR HPV typing, denies need for STD screen, strongly encouraged counseling.  SBEs, have mammogram reports faxed to our office, encouraged to schedule appoint with Dr. Towanda Malkin for implant removal.  Continue active lifestyle of tai chi, yoga and regular exercise.  Vitamin D 2000 daily encouraged.  Schedule DEXA.    Huel Cote [facility], 2:15 PM 04/02/2019

## 2019-04-02 NOTE — Addendum Note (Signed)
Addended by: Thurnell Garbe A on: 04/02/2019 03:08 PM   Modules accepted: Orders

## 2019-04-04 LAB — PAP, TP IMAGING W/ HPV RNA, RFLX HPV TYPE 16,18/45: HPV DNA High Risk: NOT DETECTED

## 2019-04-06 ENCOUNTER — Encounter: Payer: Self-pay | Admitting: *Deleted

## 2019-05-30 ENCOUNTER — Ambulatory Visit (INDEPENDENT_AMBULATORY_CARE_PROVIDER_SITE_OTHER): Payer: BC Managed Care – PPO | Admitting: Orthopedic Surgery

## 2019-05-30 ENCOUNTER — Encounter: Payer: Self-pay | Admitting: Orthopedic Surgery

## 2019-05-30 DIAGNOSIS — S46011A Strain of muscle(s) and tendon(s) of the rotator cuff of right shoulder, initial encounter: Secondary | ICD-10-CM

## 2019-06-01 ENCOUNTER — Encounter: Payer: Self-pay | Admitting: Orthopedic Surgery
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
## 2019-10-03 ENCOUNTER — Other Ambulatory Visit: Payer: Self-pay | Admitting: Internal Medicine

## 2019-10-03 NOTE — Telephone Encounter (Signed)
Reviewed chart pt is up-to-date sent refills to [facility].../[facility]  

## 2019-10-03 NOTE — Telephone Encounter (Signed)
    Patient calling to request refill on benazepril (LOTENSIN) 40 MG tablet. Patient declined to schedule appt at this times

## 2020-01-30 DIAGNOSIS — Z1231 Encounter for screening mammogram for malignant neoplasm of breast: Secondary | ICD-10-CM | POA: Diagnosis not present

## 2020-03-31 ENCOUNTER — Other Ambulatory Visit: Payer: Self-pay | Admitting: Internal Medicine

## 2020-03-31 NOTE — Telephone Encounter (Signed)
Please refill as per office routine med refill policy (all routine meds refilled for 3 mo or monthly per pt preference up to one year from last visit, then month to month grace period for 3 mo, then further med refills will have to be denied)  

## 2020-04-04 ENCOUNTER — Other Ambulatory Visit: Payer: Self-pay

## 2020-04-04 ENCOUNTER — Encounter: Payer: Self-pay | Admitting: Internal Medicine

## 2020-04-04 ENCOUNTER — Ambulatory Visit (INDEPENDENT_AMBULATORY_CARE_PROVIDER_SITE_OTHER): Payer: BC Managed Care – PPO | Admitting: Internal Medicine

## 2020-04-04 VITALS — BP 130/80 | HR 79 | Temp 97.9°F | Ht 63.0 in | Wt 130.0 lb

## 2020-04-04 DIAGNOSIS — R739 Hyperglycemia, unspecified: Secondary | ICD-10-CM | POA: Diagnosis not present

## 2020-04-04 DIAGNOSIS — I10 Essential (primary) hypertension: Secondary | ICD-10-CM

## 2020-04-04 DIAGNOSIS — Z0000 Encounter for general adult medical examination without abnormal findings: Secondary | ICD-10-CM

## 2020-04-04 MED ORDER — BENAZEPRIL HCL 40 MG PO TABS: ORAL_TABLET | ORAL | 3 refills | Status: DC

## 2020-04-04 NOTE — Assessment & Plan Note (Signed)

## 2020-04-04 NOTE — Assessment & Plan Note (Signed)
stable overall by history and exam, recent data reviewed with pt, and pt to continue medical treatment as before,  to f/u any worsening symptoms or concerns  

## 2020-04-04 NOTE — Patient Instructions (Signed)
Your A1c was ok today  Please continue all other medications as before, and refills have been done if requested.  Please have the pharmacy call with any other refills you may need.  Please continue your efforts at being more active, low cholesterol diet, and weight control.  You are otherwise up to date with prevention measures today.  Please keep your appointments with your specialists as you may have planned  Please make an Appointment to return for your 1 year visit, or sooner if needed  

## 2020-04-04 NOTE — Progress Notes (Signed)
Subjective:    Patient ID: Cassandra Garner, female    DOB: [birth year], 65 y.o.   MRN: [id number]  HPI  Here for wellness and f/u;  Overall doing ok;  Pt denies Chest pain, worsening SOB, DOE, wheezing, orthopnea, PND, worsening LE edema, palpitations, dizziness or syncope.  Pt denies neurological change such as new headache, facial or extremity weakness.  Pt denies polydipsia, polyuria, or low sugar symptoms. Pt states overall good compliance with treatment and medications, good tolerability, and has been trying to follow appropriate diet.  Pt denies worsening depressive symptoms, suicidal ideation or panic. No fever, night sweats, wt loss, loss of appetite, or other constitutional symptoms.  Pt states good ability with ADL's, has low fall risk, home safety reviewed and adequate, no other significant changes in hearing or vision, and remains very active with yoga and swimming almost daily.   Wt Readings from Last 3 Encounters:  04/04/20 130 lb (59 kg)  04/02/19 133 lb (60.3 kg)  02/14/19 130 lb (59 kg)   Past Medical History:  Diagnosis Date  . Diverticulitis 01/10/2018  . Hypertension   . PTSD (post-traumatic stress disorder)    Past Surgical History:  Procedure Laterality Date  . PLACEMENT OF BREAST IMPLANTS     known ruptures  . RHINOPLASTY      reports that she has never smoked. She has never used smokeless tobacco. She reports current alcohol use. She reports that she does not use drugs. family history includes Hypertension in her father; Parkinson's disease in her mother. Allergies  Allergen Reactions  . Amlodipine     Dizzy, palpitations  . Penicillins Other (See Comments)    Has patient had a PCN reaction causing immediate rash, facial/tongue/throat swelling, SOB or lightheadedness with hypotension: Unknown Has patient had a PCN reaction causing severe rash involving mucus membranes or skin necrosis: No Has patient had a PCN reaction that required hospitalization: No Has  patient had a PCN reaction occurring within the last 10 years: No If all of the above answers are "NO", then may proceed with Cephalosporin use.   . Sulfa Antibiotics Rash   Current Outpatient Medications on File Prior to Visit  Medication Sig Dispense Refill  . cholecalciferol (VITAMIN D3) 25 MCG (1000 UT) tablet Take 1,000 Units by mouth daily.    Marland Kitchen co-enzyme Q-10 30 MG capsule Take 30 mg by mouth daily.    Marland Kitchen desoximetasone (TOPICORT) 0.05 % cream Apply 1 application topically 2 (two) times daily as needed (eczema).     . Multiple Vitamins-Minerals (MULTIVITAMIN ADULT) TABS Take 1 tablet by mouth daily.    . Oregano 1500 MG CAPS Take 1 capsule by mouth daily.    . vitamin B-12 (CYANOCOBALAMIN) 100 MCG tablet Take 100 mcg by mouth daily.    . vitamin C (ASCORBIC ACID) 500 MG tablet Take 500 mg by mouth daily.     No current facility-administered medications on file prior to visit.   Review of Systems All otherwise neg per pt    Objective:   Physical Exam BP 130/80 (BP Location: Left Arm, Patient Position: Sitting, Cuff Size: Large)   Pulse 79   Temp 97.9 F (36.6 C) (Oral)   Ht 5\' 3"  (1.6 m)   Wt 130 lb (59 kg)   SpO2 97%   BMI 23.03 kg/m  VS noted,  Constitutional: Pt appears in NAD HENT: Head: NCAT.  Right Ear: External ear normal.  Left Ear: External ear normal.  Eyes: . Pupils are equal,  round, and reactive to light. Conjunctivae and EOM are normal Nose: without d/c or deformity Neck: Neck supple. Gross normal ROM Cardiovascular: Normal rate and regular rhythm.   Pulmonary/Chest: Effort normal and breath sounds without rales or wheezing.  Abd:  Soft, NT, ND, + BS, no organomegaly Neurological: Pt is alert. At baseline orientation, motor grossly intact Skin: Skin is warm. No rashes, other new lesions, no LE edema Psychiatric: Pt behavior is normal without agitation  All otherwise neg per pt Lab Results  Component Value Date   WBC 5.1 02/03/2019   HGB 14.7  02/03/2019   HCT 44.8 02/03/2019   PLT 246 02/03/2019   GLUCOSE 99 02/03/2019   ALT 18 02/03/2019   AST 21 02/03/2019   NA 141 02/03/2019   K 3.6 02/03/2019   CL 107 02/03/2019   CREATININE 0.81 02/03/2019   BUN 13 02/03/2019   CO2 24 02/03/2019   TSH 2.29 01/20/2018      Assessment & Plan:

## 2020-07-09 ENCOUNTER — Other Ambulatory Visit: Payer: Self-pay | Admitting: Internal Medicine

## 2020-10-23 DIAGNOSIS — T8549XD Other mechanical complication of breast prosthesis and implant, subsequent encounter: Secondary | ICD-10-CM | POA: Diagnosis not present

## 2020-10-23 DIAGNOSIS — Z1239 Encounter for other screening for malignant neoplasm of breast: Secondary | ICD-10-CM | POA: Diagnosis not present

## 2020-12-21 DIAGNOSIS — M25559 Pain in unspecified hip: Secondary | ICD-10-CM | POA: Insufficient documentation

## 2021-01-26 ENCOUNTER — Telehealth: Payer: Self-pay

## 2021-01-26 NOTE — Telephone Encounter (Signed)
Spouse has called in on behalf of patient  requesting to transfer from Dr. Jenny Reichmann to Dr. Birdie Riddle.  States patient has been referred to to Dr. Birdie Riddle by a family friend and states [facility] is closer to her house.    Please advise.   Also, Spouse states that patient is has had abdominal pain for a couple weeks.  I have transferred spouse to [facility] for triage.  Triage note will go to [place].

## 2021-01-26 NOTE — Telephone Encounter (Signed)
Unfortunately I am not able to accept at this time.

## 2021-01-26 NOTE — Telephone Encounter (Signed)
[facility] FYI  The caller states that she is having left upper quadrant pain. Feels full and bloated. Nauseated but no vomiting. She states that it feels like it is in the spleen and radiates downward. She had a spell of vertigo on the way to a funeral. No fever.   Advised to GO TO ED NOW: * Another adult should drive. * You need to be seen in the Emergency Department.  Patient understood and agreed.

## 2021-01-28 ENCOUNTER — Other Ambulatory Visit: Payer: Self-pay

## 2021-01-28 ENCOUNTER — Encounter: Payer: Self-pay | Admitting: Internal Medicine

## 2021-01-28 ENCOUNTER — Ambulatory Visit (INDEPENDENT_AMBULATORY_CARE_PROVIDER_SITE_OTHER): Payer: BC Managed Care – PPO | Admitting: Internal Medicine

## 2021-01-28 VITALS — BP 108/70 | HR 80 | Temp 98.5°F | Ht 63.0 in | Wt 132.8 lb

## 2021-01-28 DIAGNOSIS — S63501A Unspecified sprain of right wrist, initial encounter: Secondary | ICD-10-CM | POA: Insufficient documentation

## 2021-01-28 DIAGNOSIS — F419 Anxiety disorder, unspecified: Secondary | ICD-10-CM

## 2021-01-28 DIAGNOSIS — S93412A Sprain of calcaneofibular ligament of left ankle, initial encounter: Secondary | ICD-10-CM | POA: Insufficient documentation

## 2021-01-28 DIAGNOSIS — W57XXXA Bitten or stung by nonvenomous insect and other nonvenomous arthropods, initial encounter: Secondary | ICD-10-CM | POA: Diagnosis not present

## 2021-01-28 DIAGNOSIS — R1012 Left upper quadrant pain: Secondary | ICD-10-CM | POA: Insufficient documentation

## 2021-01-28 DIAGNOSIS — M542 Cervicalgia: Secondary | ICD-10-CM | POA: Insufficient documentation

## 2021-01-28 DIAGNOSIS — S20211A Contusion of right front wall of thorax, initial encounter: Secondary | ICD-10-CM | POA: Insufficient documentation

## 2021-01-28 DIAGNOSIS — M75101 Unspecified rotator cuff tear or rupture of right shoulder, not specified as traumatic: Secondary | ICD-10-CM | POA: Insufficient documentation

## 2021-01-28 DIAGNOSIS — S96911A Strain of unspecified muscle and tendon at ankle and foot level, right foot, initial encounter: Secondary | ICD-10-CM | POA: Insufficient documentation

## 2021-01-28 LAB — HEPATIC FUNCTION PANEL
ALT: 12 U/L (ref 0–35)
AST: 16 U/L (ref 0–37)
Albumin: 4.4 g/dL (ref 3.5–5.2)
Alkaline Phosphatase: 56 U/L (ref 39–117)
Bilirubin, Direct: 0.1 mg/dL (ref 0.0–0.3)
Total Bilirubin: 0.6 mg/dL (ref 0.2–1.2)
Total Protein: 7 g/dL (ref 6.0–8.3)

## 2021-01-28 LAB — URINALYSIS, ROUTINE W REFLEX MICROSCOPIC
Bilirubin Urine: NEGATIVE
Hgb urine dipstick: NEGATIVE
Ketones, ur: NEGATIVE
Leukocytes,Ua: NEGATIVE
Nitrite: NEGATIVE
Specific Gravity, Urine: 1.01 (ref 1.000–1.030)
Total Protein, Urine: NEGATIVE
Urine Glucose: NEGATIVE
Urobilinogen, UA: 0.2 (ref 0.0–1.0)
pH: 7.5 (ref 5.0–8.0)

## 2021-01-28 LAB — CBC WITH DIFFERENTIAL/PLATELET
Basophils Absolute: 0 10*3/uL (ref 0.0–0.1)
Basophils Relative: 0.9 % (ref 0.0–3.0)
Eosinophils Absolute: 0.1 10*3/uL (ref 0.0–0.7)
Eosinophils Relative: 2.7 % (ref 0.0–5.0)
HCT: 42.5 % (ref 36.0–46.0)
Hemoglobin: 14.5 g/dL (ref 12.0–15.0)
Lymphocytes Relative: 30.8 % (ref 12.0–46.0)
Lymphs Abs: 1.7 10*3/uL (ref 0.7–4.0)
MCHC: 34 g/dL (ref 30.0–36.0)
MCV: 88.7 fl (ref 78.0–100.0)
Monocytes Absolute: 0.4 10*3/uL (ref 0.1–1.0)
Monocytes Relative: 8 % (ref 3.0–12.0)
Neutro Abs: 3.2 10*3/uL (ref 1.4–7.7)
Neutrophils Relative %: 57.6 % (ref 43.0–77.0)
Platelets: 244 10*3/uL (ref 150.0–400.0)
RBC: 4.79 Mil/uL (ref 3.87–5.11)
RDW: 12.8 % (ref 11.5–15.5)
WBC: 5.6 10*3/uL (ref 4.0–10.5)

## 2021-01-28 LAB — BASIC METABOLIC PANEL
BUN: 14 mg/dL (ref 6–23)
CO2: 29 mEq/L (ref 19–32)
Calcium: 9.4 mg/dL (ref 8.4–10.5)
Chloride: 105 mEq/L (ref 96–112)
Creatinine, Ser: 0.88 mg/dL (ref 0.40–1.20)
GFR: 69.86 mL/min (ref >60.00)
Glucose, Bld: 93 mg/dL (ref 70–99)
Potassium: 4.3 mEq/L (ref 3.5–5.1)
Sodium: 140 mEq/L (ref 135–145)

## 2021-01-28 LAB — LIPASE: Lipase: 17 U/L (ref 11.0–59.0)

## 2021-01-28 MED ORDER — DOXYCYCLINE HYCLATE 100 MG PO TABS: 100.0000 mg | ORAL_TABLET | Freq: Two times a day (BID) | ORAL | 0 refills | Status: DC

## 2021-01-28 MED ORDER — BENAZEPRIL HCL 40 MG PO TABS: ORAL_TABLET | ORAL | 2 refills | Status: DC

## 2021-01-28 NOTE — Telephone Encounter (Signed)
I have made pt's husband aware that Dr. Birdie Riddle can't accept his wife at this time. They said they will check back at a later time.

## 2021-01-28 NOTE — Progress Notes (Signed)
Patient ID: Cassandra Garner, female   DOB: 10/27/1955, 65 y.o.   MRN: [id number]        Chief Complaint: LUQ pain       HPI:  Cassandra Garner is a 65 y.o. female here with c/o 1 mo onset recurring LUQ pain, sharp lasting mintues at a time, mild to mod, intermittent, no radiation, but with some bloating and fullness, worse in the AM, can be felt at times to the LLQ, associ with minor nausea no vomiting, fever, change in bowel habits or blood.   Pt denies fever, wt loss, night sweats, loss of appetite, or other constitutional symptoms  Pt denies chest pain, increased sob or doe, wheezing, orthopnea, PND, increased LE swelling, palpitations, dizziness or syncope.   Pt denies polydipsia, polyuria, or new focal neuro s/s.  Denies worsening depressive symptoms, suicidal ideation, or panic. Also incdently with c/o left post neck tick bite now with 2 days wrosening redness, tender surrounding       Wt Readings from Last 3 Encounters:  01/28/21 132 lb 12.8 oz (60.2 kg)  04/04/20 130 lb (59 kg)  04/02/19 133 lb (60.3 kg)   BP Readings from Last 3 Encounters:  01/28/21 108/70  04/04/20 130/80  04/02/19 118/72         Past Medical History:  Diagnosis Date   Diverticulitis 01/10/2018   Hypertension    PTSD (post-traumatic stress disorder)    Past Surgical History:  Procedure Laterality Date   PLACEMENT OF BREAST IMPLANTS     known ruptures   RHINOPLASTY      reports that she has never smoked. She has never used smokeless tobacco. She reports current alcohol use. She reports that she does not use drugs. family history includes Hypertension in her father; Parkinson's disease in her mother. Allergies  Allergen Reactions   Amlodipine     Dizzy, palpitations   Other Other (See Comments)   Penicillins Other (See Comments)    Has patient had a PCN reaction causing immediate rash, facial/tongue/throat swelling, SOB or lightheadedness with hypotension: Unknown Has patient had a PCN reaction  causing severe rash involving mucus membranes or skin necrosis: No Has patient had a PCN reaction that required hospitalization: No Has patient had a PCN reaction occurring within the last 10 years: No If all of the above answers are "NO", then may proceed with Cephalosporin use.    Sulfa Antibiotics Rash   Current Outpatient Medications on File Prior to Visit  Medication Sig Dispense Refill   cholecalciferol (VITAMIN D3) 25 MCG (1000 UT) tablet Take 1,000 Units by mouth daily.     co-enzyme Q-10 30 MG capsule Take 30 mg by mouth daily.     desoximetasone (TOPICORT) 0.05 % cream Apply 1 application topically 2 (two) times daily as needed (eczema).      Multiple Vitamins-Minerals (MULTIVITAMIN ADULT) TABS Take 1 tablet by mouth daily.     vitamin B-12 (CYANOCOBALAMIN) 100 MCG tablet Take 100 mcg by mouth daily.     vitamin C (ASCORBIC ACID) 500 MG tablet Take 500 mg by mouth daily.     Oregano 1500 MG CAPS Take 1 capsule by mouth daily. (Patient not taking: Reported on 01/28/2021)     No current facility-administered medications on file prior to visit.        ROS:  All others reviewed and negative.  Objective        PE:  BP 108/70 (BP Location: Left Arm, Patient Position: Sitting, Cuff Size: Normal)  Pulse 80   Temp 98.5 F (36.9 C) (Oral)   Ht 5\' 3"  (1.6 m)   Wt 132 lb 12.8 oz (60.2 kg)   SpO2 98%   BMI 23.52 kg/m                 Constitutional: Pt appears in NAD               HENT: Head: NCAT.                Right Ear: External ear normal.                 Left Ear: External ear normal.                Eyes: . Pupils are equal, round, and reactive to light. Conjunctivae and EOM are normal               Nose: without d/c or deformity               Neck: Neck supple. Gross normal ROM               Cardiovascular: Normal rate and regular rhythm.                 Pulmonary/Chest: Effort normal and breath sounds without rales or wheezing.                Abd:  Soft, NT, ND, + BS, no  organomegaly               Neurological: Pt is alert. At baseline orientation, motor grossly intact               Skin:  LE edema - none, left post neck with 1 cm are red, tender, swelling               Psychiatric: Pt behavior is normal without agitation but 1-2 + nervous  Micro: none  Cardiac tracings I have personally interpreted today:  none  Pertinent Radiological findings (summarize): none   Lab Results  Component Value Date   WBC 5.6 01/28/2021   HGB 14.5 01/28/2021   HCT 42.5 01/28/2021   PLT 244.0 01/28/2021   GLUCOSE 93 01/28/2021   ALT 12 01/28/2021   AST 16 01/28/2021   NA 140 01/28/2021   K 4.3 01/28/2021   CL 105 01/28/2021   CREATININE 0.88 01/28/2021   BUN 14 01/28/2021   CO2 29 01/28/2021   TSH 2.29 01/20/2018   Assessment/Plan:  Cassandra Garner is a 64 y.o. White or Caucasian [1] female with  has a past medical history of Diverticulitis (01/10/2018), Hypertension, and PTSD (post-traumatic stress disorder).  LUQ pain Etiology unclear, possible functional bowel pain? But unclear, for cbc and labs ordered, also abd u/s  Tick bite, infected Mild to mod, for antibx course,  to f/u any worsening symptoms or concerns  Anxiety Mild to mod, pt declines need for further change in tx or referral cousneling  Followup: Return in about 6 months (around 07/30/2021).  Cathlean Cower, MD 02/02/2021 10:12 PM [facility]

## 2021-01-28 NOTE — Patient Instructions (Signed)
Please take all new medication as prescribed - the antibiotic  Please continue all other medications as before, and refills have been done if requested.  Please have the pharmacy call with any other refills you may need.  Please continue your efforts at being more active, low cholesterol diet, and weight control.  Please keep your appointments with your specialists as you may have planned  You will be contacted regarding the referral for: ultrasound  Please go to the LAB at the blood drawing area for the tests to be done  You will be contacted by phone if any changes need to be made immediately.  Otherwise, you will receive a letter about your results with an explanation, but please check with MyChart first.  Please remember to sign up for MyChart if you have not done so, as this will be important to you in the future with finding out test results, communicating by private email, and scheduling acute appointments online when needed.  Please make an Appointment to return in 6 months, or sooner if needed

## 2021-01-29 ENCOUNTER — Encounter: Payer: Self-pay | Admitting: Internal Medicine

## 2021-01-29 ENCOUNTER — Ambulatory Visit
Admission: RE | Admit: 2021-01-29 | Discharge: 2021-01-29 | Disposition: A | Discharge status: Home / Self Care | Payer: BC Managed Care – PPO | Source: Ambulatory Visit | Attending: Internal Medicine | Admitting: Internal Medicine

## 2021-01-29 DIAGNOSIS — R1012 Left upper quadrant pain: Secondary | ICD-10-CM

## 2021-02-02 ENCOUNTER — Encounter: Payer: Self-pay | Admitting: Internal Medicine

## 2021-02-02 DIAGNOSIS — F419 Anxiety disorder, unspecified: Secondary | ICD-10-CM | POA: Insufficient documentation

## 2021-02-02 NOTE — Assessment & Plan Note (Signed)
Mild to mod, for antibx course,  to f/u any worsening symptoms or concerns 

## 2021-02-02 NOTE — Assessment & Plan Note (Signed)
Mild to mod, pt declines need for further change in tx or referral cousneling

## 2021-02-02 NOTE — Assessment & Plan Note (Signed)
Etiology unclear, possible functional bowel pain? But unclear, for cbc and labs ordered, also abd u/s

## 2021-02-12 ENCOUNTER — Other Ambulatory Visit (HOSPITAL_COMMUNITY)
Admission: RE | Admit: 2021-02-12 | Discharge: 2021-02-12 | Disposition: A | Discharge status: Home / Self Care | Payer: BC Managed Care – PPO | Source: Ambulatory Visit | Attending: Nurse Practitioner | Admitting: Nurse Practitioner

## 2021-02-12 ENCOUNTER — Ambulatory Visit (INDEPENDENT_AMBULATORY_CARE_PROVIDER_SITE_OTHER): Payer: BC Managed Care – PPO | Admitting: Nurse Practitioner

## 2021-02-12 ENCOUNTER — Other Ambulatory Visit: Payer: Self-pay

## 2021-02-12 VITALS — BP 132/80 | HR 79 | Ht 64.0 in | Wt 133.0 lb

## 2021-02-12 DIAGNOSIS — Z01419 Encounter for gynecological examination (general) (routine) without abnormal findings: Secondary | ICD-10-CM

## 2021-02-12 DIAGNOSIS — Z780 Asymptomatic menopausal state: Secondary | ICD-10-CM | POA: Diagnosis not present

## 2021-02-12 DIAGNOSIS — M8580 Other specified disorders of bone density and structure, unspecified site: Secondary | ICD-10-CM | POA: Diagnosis not present
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
## 2021-02-13 LAB — CYTOLOGY - PAP: Diagnosis: NEGATIVE

## 2021-02-17 ENCOUNTER — Telehealth: Payer: Self-pay

## 2021-02-17 NOTE — Telephone Encounter (Signed)
Results given.

## 2021-02-17 NOTE — Telephone Encounter (Signed)
Patient would like a callback to go over her imaging on 01/29/2021.  Please call back at [phone]

## 2021-02-17 NOTE — Telephone Encounter (Signed)
Patient called for Pap result. Informed normal pap smear result.

## 2021-03-06 DIAGNOSIS — F329 Major depressive disorder, single episode, unspecified: Secondary | ICD-10-CM | POA: Diagnosis not present

## 2021-03-06 DIAGNOSIS — F32A Depression, unspecified: Secondary | ICD-10-CM | POA: Diagnosis not present

## 2021-03-10 DIAGNOSIS — Z0000 Encounter for general adult medical examination without abnormal findings: Secondary | ICD-10-CM | POA: Diagnosis not present

## 2021-03-10 DIAGNOSIS — I10 Essential (primary) hypertension: Secondary | ICD-10-CM | POA: Diagnosis not present

## 2021-03-10 DIAGNOSIS — T8549XD Other mechanical complication of breast prosthesis and implant, subsequent encounter: Secondary | ICD-10-CM | POA: Diagnosis not present

## 2021-03-10 DIAGNOSIS — E039 Hypothyroidism, unspecified: Secondary | ICD-10-CM | POA: Diagnosis not present

## 2021-03-10 DIAGNOSIS — Z1239 Encounter for other screening for malignant neoplasm of breast: Secondary | ICD-10-CM | POA: Diagnosis not present

## 2021-03-10 DIAGNOSIS — F439 Reaction to severe stress, unspecified: Secondary | ICD-10-CM | POA: Diagnosis not present

## 2021-03-10 DIAGNOSIS — Z1501 Genetic susceptibility to malignant neoplasm of breast: Secondary | ICD-10-CM | POA: Diagnosis not present

## 2021-03-11 DIAGNOSIS — L309 Dermatitis, unspecified: Secondary | ICD-10-CM | POA: Diagnosis not present

## 2021-03-11 DIAGNOSIS — Z01818 Encounter for other preprocedural examination: Secondary | ICD-10-CM | POA: Diagnosis not present

## 2021-03-11 DIAGNOSIS — Z1501 Genetic susceptibility to malignant neoplasm of breast: Secondary | ICD-10-CM | POA: Diagnosis not present

## 2021-03-11 DIAGNOSIS — S299XXA Unspecified injury of thorax, initial encounter: Secondary | ICD-10-CM | POA: Insufficient documentation

## 2021-03-11 DIAGNOSIS — T8549XD Other mechanical complication of breast prosthesis and implant, subsequent encounter: Secondary | ICD-10-CM | POA: Diagnosis not present

## 2021-03-13 DIAGNOSIS — Z20822 Contact with and (suspected) exposure to covid-19: Secondary | ICD-10-CM | POA: Diagnosis not present

## 2021-03-24 DIAGNOSIS — Z09 Encounter for follow-up examination after completed treatment for conditions other than malignant neoplasm: Secondary | ICD-10-CM | POA: Diagnosis not present

## 2021-03-30 DIAGNOSIS — Z09 Encounter for follow-up examination after completed treatment for conditions other than malignant neoplasm: Secondary | ICD-10-CM | POA: Diagnosis not present

## 2021-03-30 DIAGNOSIS — Z1239 Encounter for other screening for malignant neoplasm of breast: Secondary | ICD-10-CM | POA: Diagnosis not present

## 2021-04-03 DIAGNOSIS — Z09 Encounter for follow-up examination after completed treatment for conditions other than malignant neoplasm: Secondary | ICD-10-CM | POA: Diagnosis not present

## 2021-04-22 DIAGNOSIS — Z09 Encounter for follow-up examination after completed treatment for conditions other than malignant neoplasm: Secondary | ICD-10-CM | POA: Diagnosis not present

## 2021-04-22 DIAGNOSIS — E039 Hypothyroidism, unspecified: Secondary | ICD-10-CM | POA: Diagnosis not present

## 2021-04-22 DIAGNOSIS — K649 Unspecified hemorrhoids: Secondary | ICD-10-CM | POA: Diagnosis not present

## 2021-05-18 DIAGNOSIS — F329 Major depressive disorder, single episode, unspecified: Secondary | ICD-10-CM | POA: Diagnosis not present

## 2021-05-27 DIAGNOSIS — F4310 Post-traumatic stress disorder, unspecified: Secondary | ICD-10-CM | POA: Diagnosis not present

## 2021-05-27 DIAGNOSIS — I10 Essential (primary) hypertension: Secondary | ICD-10-CM | POA: Diagnosis not present

## 2021-05-27 DIAGNOSIS — F419 Anxiety disorder, unspecified: Secondary | ICD-10-CM | POA: Diagnosis not present

## 2021-06-08 DIAGNOSIS — F329 Major depressive disorder, single episode, unspecified: Secondary | ICD-10-CM | POA: Diagnosis not present

## 2021-06-15 ENCOUNTER — Ambulatory Visit (INDEPENDENT_AMBULATORY_CARE_PROVIDER_SITE_OTHER): Payer: BC Managed Care – PPO | Admitting: Orthopedic Surgery

## 2021-06-15 ENCOUNTER — Ambulatory Visit: Payer: Self-pay

## 2021-06-15 ENCOUNTER — Other Ambulatory Visit: Payer: Self-pay

## 2021-06-15 DIAGNOSIS — M25552 Pain in left hip: Secondary | ICD-10-CM | POA: Diagnosis not present

## 2021-06-15 DIAGNOSIS — F329 Major depressive disorder, single episode, unspecified: Secondary | ICD-10-CM | POA: Diagnosis not present

## 2021-06-18 ENCOUNTER — Encounter: Payer: Self-pay | Admitting: Orthopedic Surgery
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
## 2021-06-22 DIAGNOSIS — F329 Major depressive disorder, single episode, unspecified: Secondary | ICD-10-CM | POA: Diagnosis not present

## 2021-07-10 ENCOUNTER — Telehealth: Payer: Self-pay | Admitting: Internal Medicine

## 2021-07-10 DIAGNOSIS — R21 Rash and other nonspecific skin eruption: Secondary | ICD-10-CM

## 2021-07-10 NOTE — Telephone Encounter (Signed)
Patient requesting a call back to discuss possible referral to dermatologist due to skin on chest irritation

## 2021-07-10 NOTE — Telephone Encounter (Signed)
Ok referral done 

## 2021-07-13 ENCOUNTER — Other Ambulatory Visit: Payer: Self-pay

## 2021-07-13 ENCOUNTER — Ambulatory Visit
Admission: RE | Admit: 2021-07-13 | Discharge: 2021-07-13 | Disposition: A | Discharge status: Home / Self Care | Payer: BC Managed Care – PPO | Source: Ambulatory Visit | Attending: Orthopedic Surgery | Admitting: Orthopedic Surgery

## 2021-07-13 DIAGNOSIS — M25552 Pain in left hip: Secondary | ICD-10-CM | POA: Diagnosis not present

## 2021-07-15 ENCOUNTER — Ambulatory Visit (INDEPENDENT_AMBULATORY_CARE_PROVIDER_SITE_OTHER): Payer: BC Managed Care – PPO | Admitting: Orthopedic Surgery

## 2021-07-15 ENCOUNTER — Other Ambulatory Visit: Payer: Self-pay

## 2021-07-15 ENCOUNTER — Encounter: Payer: Self-pay | Admitting: Orthopedic Surgery

## 2021-07-15 DIAGNOSIS — M25552 Pain in left hip: Secondary | ICD-10-CM

## 2021-07-19 ENCOUNTER — Encounter: Payer: Self-pay | Admitting: Orthopedic Surgery
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
## 2021-07-20 DIAGNOSIS — F329 Major depressive disorder, single episode, unspecified: Secondary | ICD-10-CM | POA: Diagnosis not present

## 2021-07-20 NOTE — Telephone Encounter (Signed)
Patient calling to check status of referral, informed patient referral was sent on 07-10-2021 and waiting to be processed

## 2021-07-27 DIAGNOSIS — F329 Major depressive disorder, single episode, unspecified: Secondary | ICD-10-CM | POA: Diagnosis not present

## 2021-07-29 ENCOUNTER — Other Ambulatory Visit: Payer: Self-pay

## 2021-07-29 ENCOUNTER — Telehealth: Payer: Self-pay | Admitting: Orthopedic Surgery

## 2021-07-29 ENCOUNTER — Encounter: Payer: Self-pay | Admitting: Internal Medicine

## 2021-07-29 ENCOUNTER — Ambulatory Visit (INDEPENDENT_AMBULATORY_CARE_PROVIDER_SITE_OTHER): Payer: BC Managed Care – PPO | Admitting: Internal Medicine

## 2021-07-29 VITALS — BP 138/80 | HR 72 | Temp 97.9°F | Ht 64.0 in | Wt 133.0 lb

## 2021-07-29 DIAGNOSIS — I10 Essential (primary) hypertension: Secondary | ICD-10-CM

## 2021-07-29 DIAGNOSIS — E559 Vitamin D deficiency, unspecified: Secondary | ICD-10-CM

## 2021-07-29 DIAGNOSIS — R739 Hyperglycemia, unspecified: Secondary | ICD-10-CM | POA: Diagnosis not present

## 2021-07-29 DIAGNOSIS — F419 Anxiety disorder, unspecified: Secondary | ICD-10-CM

## 2021-07-29 DIAGNOSIS — E538 Deficiency of other specified B group vitamins: Secondary | ICD-10-CM | POA: Diagnosis not present

## 2021-07-29 DIAGNOSIS — Z0001 Encounter for general adult medical examination with abnormal findings: Secondary | ICD-10-CM | POA: Diagnosis not present

## 2021-07-29 DIAGNOSIS — E039 Hypothyroidism, unspecified: Secondary | ICD-10-CM

## 2021-07-29 LAB — HEPATIC FUNCTION PANEL
ALT: 17 U/L (ref 0–35)
AST: 18 U/L (ref 0–37)
Albumin: 4.2 g/dL (ref 3.5–5.2)
Alkaline Phosphatase: 62 U/L (ref 39–117)
Bilirubin, Direct: 0.1 mg/dL (ref 0.0–0.3)
Total Bilirubin: 0.4 mg/dL (ref 0.2–1.2)
Total Protein: 6.7 g/dL (ref 6.0–8.3)

## 2021-07-29 LAB — CBC WITH DIFFERENTIAL/PLATELET
Basophils Absolute: 0 10*3/uL (ref 0.0–0.1)
Basophils Relative: 0.8 % (ref 0.0–3.0)
Eosinophils Absolute: 0.1 10*3/uL (ref 0.0–0.7)
Eosinophils Relative: 3 % (ref 0.0–5.0)
HCT: 42.4 % (ref 36.0–46.0)
Hemoglobin: 14.2 g/dL (ref 12.0–15.0)
Lymphocytes Relative: 35.7 % (ref 12.0–46.0)
Lymphs Abs: 1.5 10*3/uL (ref 0.7–4.0)
MCHC: 33.4 g/dL (ref 30.0–36.0)
MCV: 89.1 fl (ref 78.0–100.0)
Monocytes Absolute: 0.4 10*3/uL (ref 0.1–1.0)
Monocytes Relative: 10.1 % (ref 3.0–12.0)
Neutro Abs: 2.2 10*3/uL (ref 1.4–7.7)
Neutrophils Relative %: 50.4 % (ref 43.0–77.0)
Platelets: 239 10*3/uL (ref 150.0–400.0)
RBC: 4.76 Mil/uL (ref 3.87–5.11)
RDW: 13.4 % (ref 11.5–15.5)
WBC: 4.3 10*3/uL (ref 4.0–10.5)

## 2021-07-29 LAB — URINALYSIS, ROUTINE W REFLEX MICROSCOPIC
Bilirubin Urine: NEGATIVE
Hgb urine dipstick: NEGATIVE
Ketones, ur: NEGATIVE
Leukocytes,Ua: NEGATIVE
Nitrite: NEGATIVE
Specific Gravity, Urine: 1.02 (ref 1.000–1.030)
Total Protein, Urine: NEGATIVE
Urine Glucose: NEGATIVE
Urobilinogen, UA: 0.2 (ref 0.0–1.0)
pH: 6 (ref 5.0–8.0)

## 2021-07-29 LAB — LIPID PANEL
Cholesterol: 153 mg/dL (ref 0–200)
HDL: 72.6 mg/dL (ref >39.00)
LDL Cholesterol: 70 mg/dL (ref 0–99)
NonHDL: 80.1
Total CHOL/HDL Ratio: 2
Triglycerides: 52 mg/dL (ref 0.0–149.0)
VLDL: 10.4 mg/dL (ref 0.0–40.0)

## 2021-07-29 LAB — BASIC METABOLIC PANEL
BUN: 14 mg/dL (ref 6–23)
CO2: 29 mEq/L (ref 19–32)
Calcium: 9.3 mg/dL (ref 8.4–10.5)
Chloride: 109 mEq/L (ref 96–112)
Creatinine, Ser: 0.83 mg/dL (ref 0.40–1.20)
GFR: 74.68 mL/min (ref >60.00)
Glucose, Bld: 94 mg/dL (ref 70–99)
Potassium: 5 mEq/L (ref 3.5–5.1)
Sodium: 141 mEq/L (ref 135–145)

## 2021-07-29 LAB — T4, FREE: Free T4: 0.7 ng/dL (ref 0.60–1.60)

## 2021-07-29 LAB — VITAMIN B12: Vitamin B-12: >1550 pg/mL — ABNORMAL HIGH (ref 211–911)

## 2021-07-29 LAB — VITAMIN D 25 HYDROXY (VIT D DEFICIENCY, FRACTURES): VITD: 38.21 ng/mL (ref 30.00–100.00)

## 2021-07-29 LAB — TSH: TSH: 3.65 u[IU]/mL (ref 0.35–5.50)

## 2021-07-29 LAB — HEMOGLOBIN A1C: Hgb A1c MFr Bld: 5.7 % (ref 4.6–6.5)

## 2021-07-29 MED ORDER — BENAZEPRIL HCL 40 MG PO TABS: ORAL_TABLET | ORAL | 3 refills | Status: DC

## 2021-07-29 MED ORDER — METOPROLOL SUCCINATE ER 50 MG PO TB24: 50.0000 mg | ORAL_TABLET | Freq: Every day | ORAL | 3 refills | Status: DC

## 2021-07-29 NOTE — Telephone Encounter (Signed)
IC spoke with pts husband Maudie Mercury. I informed him that as I advised him previously, we cannot "push" images. I did suggest he contact [facility] to check if they could help in this matter. I gave him [facility] phone number and date of MRI.

## 2021-07-29 NOTE — Telephone Encounter (Signed)
Pt called back I advised her of morgans message below. She verbalized understanding and stated that she would contact [facility]

## 2021-07-29 NOTE — Telephone Encounter (Signed)
Received call from pts husband Maudie Mercury, they are at [facility] clinic and needs the 11/22 MRI "pushed" or "released" so [facility] doctor can see images, they can see the 10/27 xrys but not the MRI. Please call as soon as possible [phone]

## 2021-07-29 NOTE — Progress Notes (Signed)
Patient ID: Cassandra Garner, female   DOB: 1955/08/30, 65 y.o.   MRN: [id number]         Chief Complaint:: wellness exam and Office Visit (BP concerns)  , anxiety       HPI:  Cassandra Garner is a 65 y.o. female here for wellness exam; declines shingrix and flu shot, o/w up to date                        Also left hip MRI done per Dr Alycia Rossetti with osteophyte, mild bursitis, uterine fibroid and diverticulosis.  Pt denies chest pain, increased sob or doe, wheezing, orthopnea, PND, increased LE swelling, palpitations, dizziness or syncope.   Pt denies polydipsia, polyuria, or new focal neuro s/s.   Pt denies fever, wt loss, night sweats, loss of appetite, or other constitutional symptoms  Denies worsening depressive symptoms, suicidal ideation, or panic; has ongoing anxiety, quite animated today and continues to deny significant anxiety, still seeing counseling regularly.   BP has been mild elevated at home.     Wt Readings from Last 3 Encounters:  07/29/21 133 lb (60.3 kg)  02/12/21 133 lb (60.3 kg)  01/28/21 132 lb 12.8 oz (60.2 kg)   BP Readings from Last 3 Encounters:  07/29/21 138/80  02/12/21 132/80  01/28/21 108/70   Immunization History  Administered Date(s) Administered   Tdap 04/19/2008, 07/10/2018   There are no preventive care reminders to display for this patient.     Past Medical History:  Diagnosis Date   Diverticulitis 01/10/2018   Hypertension    PTSD (post-traumatic stress disorder)    Past Surgical History:  Procedure Laterality Date   PLACEMENT OF BREAST IMPLANTS     known ruptures   RHINOPLASTY      reports that she has never smoked. She has never used smokeless tobacco. She reports current alcohol use. She reports that she does not use drugs. family history includes Hypertension in her father; Parkinson's disease in her mother. Allergies  Allergen Reactions   Amlodipine     Dizzy, palpitations   Other Other (See Comments)   Penicillins Other (See  Comments)    Has patient had a PCN reaction causing immediate rash, facial/tongue/throat swelling, SOB or lightheadedness with hypotension: Unknown Has patient had a PCN reaction causing severe rash involving mucus membranes or skin necrosis: No Has patient had a PCN reaction that required hospitalization: No Has patient had a PCN reaction occurring within the last 10 years: No If all of the above answers are "NO", then may proceed with Cephalosporin use.    Albuterol Palpitations   Sulfa Antibiotics Rash   Current Outpatient Medications on File Prior to Visit  Medication Sig Dispense Refill   cholecalciferol (VITAMIN D3) 25 MCG (1000 UT) tablet Take 1,000 Units by mouth daily.     co-enzyme Q-10 30 MG capsule Take 30 mg by mouth daily.     Multiple Vitamins-Minerals (MULTIVITAMIN ADULT) TABS Take 1 tablet by mouth daily.     vitamin B-12 (CYANOCOBALAMIN) 100 MCG tablet Take 100 mcg by mouth daily.     vitamin C (ASCORBIC ACID) 500 MG tablet Take 500 mg by mouth daily.     Oregano 1500 MG CAPS Take 1 capsule by mouth daily. (Patient not taking: Reported on 07/29/2021)     No current facility-administered medications on file prior to visit.        ROS:  All others reviewed and negative.  Objective  PE:  BP 138/80 (BP Location: Left Arm, Patient Position: Sitting, Cuff Size: Normal)   Pulse 72   Temp 97.9 F (36.6 C) (Oral)   Ht 5\' 4"  (1.626 m)   Wt 133 lb (60.3 kg)   SpO2 98%   BMI 22.83 kg/m                 Constitutional: Pt appears in NAD               HENT: Head: NCAT.                Right Ear: External ear normal.                 Left Ear: External ear normal.                Eyes: . Pupils are equal, round, and reactive to light. Conjunctivae and EOM are normal               Nose: without d/c or deformity               Neck: Neck supple. Gross normal ROM               Cardiovascular: Normal rate and regular rhythm.                 Pulmonary/Chest: Effort normal and  breath sounds without rales or wheezing.                Abd:  Soft, NT, ND, + BS, no organomegaly               Neurological: Pt is alert. At baseline orientation, motor grossly intact               Skin: Skin is warm. No rashes, no other new lesions, LE edema - none               Psychiatric: Pt behavior is normal without agitation , markedly nervous apperaing  Micro: none  Cardiac tracings I have personally interpreted today:  none  Pertinent Radiological findings (summarize): none   Lab Results  Component Value Date   WBC 4.3 07/29/2021   HGB 14.2 07/29/2021   HCT 42.4 07/29/2021   PLT 239.0 07/29/2021   GLUCOSE 94 07/29/2021   CHOL 153 07/29/2021   TRIG 52.0 07/29/2021   HDL 72.60 07/29/2021   LDLCALC 70 07/29/2021   ALT 17 07/29/2021   AST 18 07/29/2021   NA 141 07/29/2021   K 5.0 07/29/2021   CL 109 07/29/2021   CREATININE 0.83 07/29/2021   BUN 14 07/29/2021   CO2 29 07/29/2021   TSH 3.65 07/29/2021   HGBA1C 5.7 07/29/2021   Assessment/Plan:  Cassandra Garner is a 65 y.o. White or Caucasian [1] female with  has a past medical history of Diverticulitis (01/10/2018), Hypertension, and PTSD (post-traumatic stress disorder).  Encounter for well adult exam with abnormal findings Age and sex appropriate education and counseling updated with regular exercise and diet Referrals for preventative services - none needed Immunizations addressed - declines shingrix,  Smoking counseling  - none needed Evidence for depression or other mood disorder - chronic anxiety Most recent labs reviewed. I have personally reviewed and have noted: 1) the patient's medical and social history 2) The patient's current medications and supplements 3) The patient's height, weight, and BMI have been recorded in the chart   Essential (primary) hypertension Mild uncontrolled, for increased toprol xl 50 qd,  possibly to help with anxiety as well  Hypothyroidism, unspecified Lab Results   Component Value Date   TSH 3.65 07/29/2021   Stable, pt to continue levothyroxine   Hyperglycemia Lab Results  Component Value Date   HGBA1C 5.7 07/29/2021   Stable, pt to continue current medical treatment  - diet   Anxiety Pt to continue counseling  Followup: Return in about 1 year (around 07/29/2022).  Cathlean Cower, MD 08/02/2021 8:08 PM [facility]

## 2021-07-29 NOTE — Patient Instructions (Addendum)
Please consider the shingles shot before 65yo  Please consider the Novavax covid vaccine when available  Please consider the Flu shot  Please take all new medication as prescribed - the toprol XL 50 mg per day  Please continue all other medications as before, and refills have been done if requested - the benazepril  Please have the pharmacy call with any other refills you may need.  Please continue your efforts at being more active, low cholesterol diet, and weight control.  You are otherwise up to date with prevention measures today.  Please keep your appointments with your specialists as you may have planned - counseling  Please go to the LAB at the blood drawing area for the tests to be done  You will be contacted by phone if any changes need to be made immediately.  Otherwise, you will receive a letter about your results with an explanation, but please check with MyChart first. Please remember to sign up for MyChart if you have not done so, as this will be important to you in the future with finding out test results, communicating by private email, and scheduling acute appointments online when needed.  Please make an Appointment to return for your 1 year visit, or sooner if needed

## 2021-07-30 ENCOUNTER — Encounter: Payer: Self-pay | Admitting: Internal Medicine

## 2021-08-02 ENCOUNTER — Encounter: Payer: Self-pay | Admitting: Internal Medicine

## 2021-08-02 NOTE — Assessment & Plan Note (Signed)
Age and sex appropriate education and counseling updated with regular exercise and diet Referrals for preventative services - none needed Immunizations addressed - declines shingrix,  Smoking counseling  - none needed Evidence for depression or other mood disorder - chronic anxiety Most recent labs reviewed. I have personally reviewed and have noted: 1) the patient's medical and social history 2) The patient's current medications and supplements 3) The patient's height, weight, and BMI have been recorded in the chart

## 2021-08-02 NOTE — Assessment & Plan Note (Signed)
Lab Results  Component Value Date   HGBA1C 5.7 07/29/2021   Stable, pt to continue current medical treatment  - diet

## 2021-08-02 NOTE — Assessment & Plan Note (Signed)
Lab Results  Component Value Date   TSH 3.65 07/29/2021   Stable, pt to continue levothyroxine

## 2021-08-02 NOTE — Assessment & Plan Note (Signed)
Mild uncontrolled, for increased toprol xl 50 qd, possibly to help with anxiety as well

## 2021-08-02 NOTE — Assessment & Plan Note (Signed)
Pt to continue counseling

## 2021-08-03 DIAGNOSIS — F329 Major depressive disorder, single episode, unspecified: Secondary | ICD-10-CM | POA: Diagnosis not present

## 2021-08-10 DIAGNOSIS — F329 Major depressive disorder, single episode, unspecified: Secondary | ICD-10-CM | POA: Diagnosis not present

## 2021-12-11 ENCOUNTER — Encounter: Payer: Self-pay | Admitting: Family

## 2021-12-11 ENCOUNTER — Telehealth (INDEPENDENT_AMBULATORY_CARE_PROVIDER_SITE_OTHER): Payer: BC Managed Care – PPO | Admitting: Family

## 2021-12-11 VITALS — Ht 64.0 in | Wt 133.0 lb

## 2021-12-11 DIAGNOSIS — W57XXXA Bitten or stung by nonvenomous insect and other nonvenomous arthropods, initial encounter: Secondary | ICD-10-CM | POA: Diagnosis not present

## 2021-12-11 DIAGNOSIS — S20362A Insect bite (nonvenomous) of left front wall of thorax, initial encounter: Secondary | ICD-10-CM

## 2021-12-11 NOTE — Progress Notes (Signed)
? ? ?MyChart Video Visit ? ? ? ?Virtual Visit via Video Note  ? ?This visit type was conducted due to national recommendations for restrictions regarding the COVID-19 Pandemic (e.g. social distancing) in an effort to limit this patient's exposure and mitigate transmission in our community. This patient is at least at moderate risk for complications without adequate follow up. This format is felt to be most appropriate for this patient at this time. Physical exam was limited by quality of the video and audio technology used for the visit. CMA was able to get the patient set up on a video visit. ? ?Patient location: Home. Patient and provider in visit ?Provider location: Office ? ?I discussed the limitations of evaluation and management by telemedicine and the availability of in person appointments. The patient expressed understanding and agreed to proceed. ? ?Visit Date: 12/11/2021 ? ?Today's healthcare provider: Jeanie Sewer, NP  ? ? ? ?Subjective:  ? ? Patient ID: Cassandra Garner, female    DOB: [birth year], 66 y.o.   MRN: [id number] ? ?Chief Complaint  ?Patient presents with  ? Tick Removal  ?  Pt states she removed a tick yesterday near her arm pit. Pt states it is red. Has tried water and soap and put neosporin on last night.  ? ?HPI ?Skin Lesion: Patient complains of a skin lesion after a tic bite of the trunk, left upper chest towards axilla. The lesion has been present for 1 day. Lesion has not changed in size or color. Symptoms associated with the lesion are: itching. Patient denies increasing diameter, bleeding, pain, drainage, infection. ? ? ?Past Medical History:  ?Diagnosis Date  ? Diverticulitis 01/10/2018  ? Hypertension   ? PTSD (post-traumatic stress disorder)   ? ? ?Past Surgical History:  ?Procedure Laterality Date  ? PLACEMENT OF BREAST IMPLANTS    ? known ruptures  ? RHINOPLASTY    ? ? ?Outpatient Medications Prior to Visit  ?Medication Sig Dispense Refill  ? benazepril (LOTENSIN) 40 MG  tablet TAKE 1 TABLET BY MOUTH EVERY DAY. APPOINTMENT DUE IN JUNE 90 tablet 3  ? cholecalciferol (VITAMIN D3) 25 MCG (1000 UT) tablet Take 1,000 Units by mouth daily.    ? co-enzyme Q-10 30 MG capsule Take 30 mg by mouth daily.    ? Multiple Vitamins-Minerals (MULTIVITAMIN ADULT) TABS Take 1 tablet by mouth daily.    ? Oregano 1500 MG CAPS Take 1 capsule by mouth daily.    ? vitamin B-12 (CYANOCOBALAMIN) 100 MCG tablet Take 100 mcg by mouth daily.    ? vitamin C (ASCORBIC ACID) 500 MG tablet Take 500 mg by mouth daily.    ? metoprolol succinate (TOPROL-XL) 50 MG 24 hr tablet Take 1 tablet (50 mg total) by mouth daily. Take with or immediately following a meal. (Patient not taking: Reported on 12/11/2021) 90 tablet 3  ? ?No facility-administered medications prior to visit.  ? ? ?Allergies  ?Allergen Reactions  ? Amlodipine   ?  Dizzy, palpitations  ? Other Other (See Comments)  ? Penicillins Other (See Comments)  ?  Has patient had a PCN reaction causing immediate rash, facial/tongue/throat swelling, SOB or lightheadedness with hypotension: Unknown ?Has patient had a PCN reaction causing severe rash involving mucus membranes or skin necrosis: No ?Has patient had a PCN reaction that required hospitalization: No ?Has patient had a PCN reaction occurring within the last 10 years: No ?If all of the above answers are "NO", then may proceed with Cephalosporin use. ?  ? Albuterol  Palpitations  ? Sulfa Antibiotics Rash  ? ? ?   ?Objective:  ?  ? ?Physical Exam ?Vitals and nursing note reviewed.  ?Constitutional:   ?   General: She is not in acute distress. ?   Appearance: Normal appearance.  ?HENT:  ?   Head: Normocephalic.  ?Pulmonary:  ?   Effort: No respiratory distress.  ?Musculoskeletal:  ?   Cervical back: Normal range of motion.  ?Skin: ?   General: Skin is dry. Pinpoint lesion noted on left chest towards axilla with thin circle of erythema surrounding center. ?   Coloration: Skin is not pale.  ?Neurological:  ?    Mental Status: She is alert and oriented to person, place, and time.  ?Psychiatric:     ?   Mood and Affect: Mood normal.  ? ?Ht '5\' 4"'$  (1.626 m)   Wt 133 lb (60.3 kg)   BMI 22.83 kg/m?  ? ?Wt Readings from Last 3 Encounters:  ?12/11/21 133 lb (60.3 kg)  ?07/29/21 133 lb (60.3 kg)  ?02/12/21 133 lb (60.3 kg)  ? ?   ?Assessment & Plan:  ? ?Problem List Items Addressed This Visit   ?None ?Visit Diagnoses   ? ? Insect bite, unspecified site, initial encounter    -  Primary ?Able to see on video the pinpoint lesion on her left chest with small periwound erythema. Pt reports slight itching, denies any paresthesias, no rash, or bull's eye rash noted. Advised to clean with soap and water does not need to apply abt ointment, continue to monitor, if erythema worsens or rash develops, call the office.  ? ?  ?I discussed the assessment and treatment plan with the patient. The patient was provided an opportunity to ask questions and all were answered. The patient agreed with the plan and demonstrated an understanding of the instructions. ?  ?The patient was advised to call back or seek an in-person evaluation if the symptoms worsen or if the condition fails to improve as anticipated. ? ?I provided 24 minutes of face-to-face time during this encounter. ? ? ?Jeanie Sewer, NP ?[place] ?[phone] (phone) ?[phone] (fax) ? ?[facility]  ?

## 2022-01-27 ENCOUNTER — Ambulatory Visit (INDEPENDENT_AMBULATORY_CARE_PROVIDER_SITE_OTHER): Payer: BC Managed Care – PPO | Admitting: Physician Assistant

## 2022-01-27 ENCOUNTER — Encounter: Payer: Self-pay | Admitting: Physician Assistant

## 2022-01-27 DIAGNOSIS — L821 Other seborrheic keratosis: Secondary | ICD-10-CM | POA: Diagnosis not present

## 2022-01-27 DIAGNOSIS — L918 Other hypertrophic disorders of the skin: Secondary | ICD-10-CM | POA: Diagnosis not present

## 2022-01-27 DIAGNOSIS — D3613 Benign neoplasm of peripheral nerves and autonomic nervous system of lower limb, including hip: Secondary | ICD-10-CM

## 2022-01-27 DIAGNOSIS — Z1283 Encounter for screening for malignant neoplasm of skin: Secondary | ICD-10-CM

## 2022-01-27 DIAGNOSIS — D3610 Benign neoplasm of peripheral nerves and autonomic nervous system, unspecified: Secondary | ICD-10-CM

## 2022-01-27 MED ORDER — MUPIROCIN 2 % EX OINT: 1.0000 "application " | TOPICAL_OINTMENT | Freq: Two times a day (BID) | CUTANEOUS | 0 refills | Status: DC

## 2022-01-27 NOTE — Progress Notes (Signed)
   New Patient   Subjective  Cassandra Garner is a 66 y.o. female who presents for the following: New Patient (Initial Visit) (Patient here today skin check, per patient she has a lesion on her right side of her neck she would like checked. Patient also has a lump on her right dorsal hand that she would like check. ).   The following portions of the chart were reviewed this encounter and updated as appropriate:  Tobacco  Allergies  Meds  Problems  Med Hx  Surg Hx  Fam Hx      Objective  Well appearing patient in no apparent distress; mood and affect are within normal limits.  A full examination was performed including scalp, head, eyes, ears, nose, lips, neck, chest, axillae, abdomen, back, buttocks, bilateral upper extremities, bilateral lower extremities, hands, feet, fingers, toes, fingernails, and toenails. All findings within normal limits unless otherwise noted below.  Full body skin exam.  Mid Back Stuck-on, waxy papules and plaques.   Left Thigh- posterior Soft papule  Neck - Anterior Fleshy, skin-colored sessile and pedunculated papules.     Assessment & Plan  Screening for malignant neoplasm of skin  Yearly skin exam.  Seborrheic keratosis Mid Back  Benign ok to leave if stable  Neurofibroma Left Thigh- posterior  Benign- ok to leave if stable  Skin tag Neck - Anterior  mupirocin ointment (BACTROBAN) 2 % - Neck - Anterior Apply 1 application. topically 2 (two) times daily.  Epidermal / dermal shaving - Neck - Anterior  Informed consent: discussed and consent obtained   Timeout: patient name, date of birth, surgical site, and procedure verified   Instrument used: scissors   Hemostasis achieved with: ferric subsulfate   Outcome: patient tolerated procedure well       I, Lotta Frankenfield, PA-C, have reviewed all documentation's for this visit.  The documentation on 01/27/22 for the exam, diagnosis, procedures and orders are all accurate and  complete.

## 2022-01-27 NOTE — Patient Instructions (Signed)
Hand surgeon- Dr. Fredna Dow

## 2022-03-04 IMAGING — MR MR HIP*L* W/O CM
5 series · 34 of 40 positions shown · non-contrast
Comparison: Radiographs 06/15/2021

CLINICAL DATA: Left hip pain for 2.5 years.

EXAM:
MR OF THE LEFT HIP WITHOUT CONTRAST
TECHNIQUE: Multiplanar, multisequence MR imaging was performed. No intravenous
contrast was administered.

[Series 8: T2 fat-sat · coronal · left · 3.0mm · 0.89mm/px · 8 of 30 slices shown (1 of 2)]
[im 1/30]
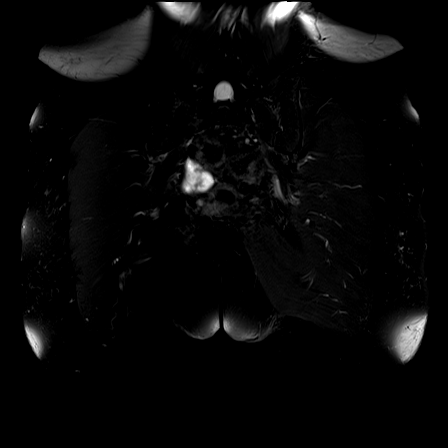
[im 5/30]
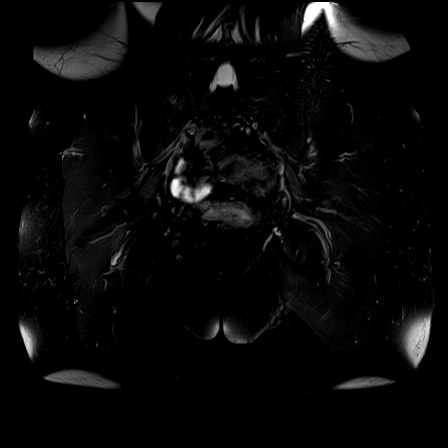
[im 9/30]
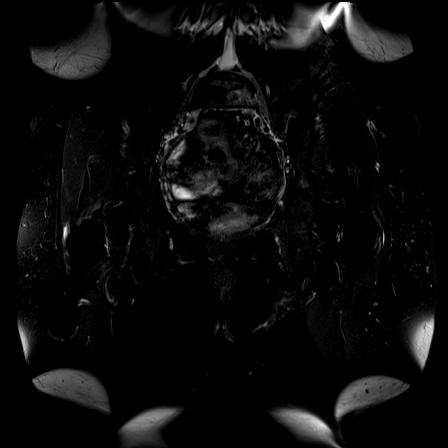
[im 13/30]
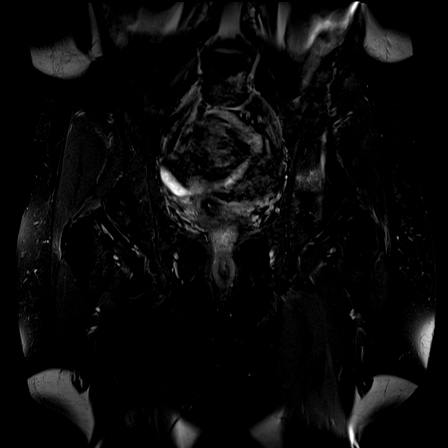
[im 17/30]
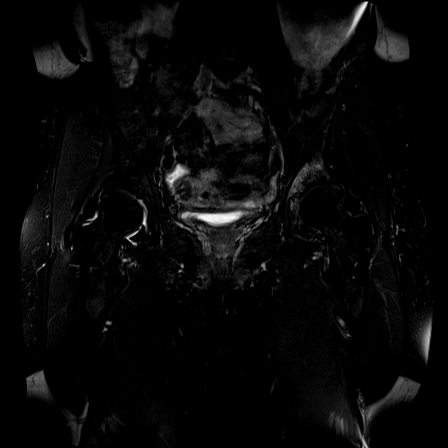
[im 21/30]
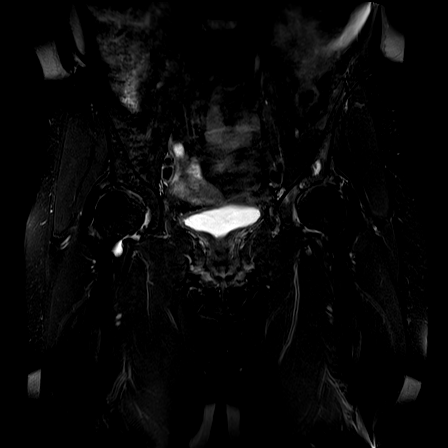
[im 25/30]
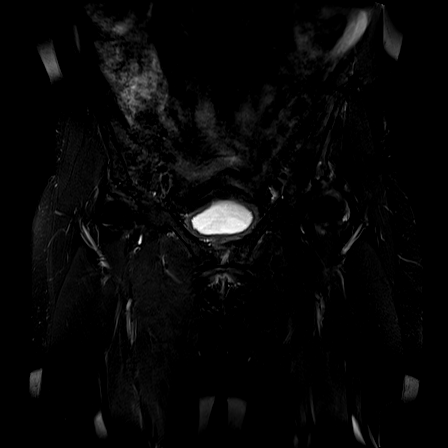
[im 30/30]
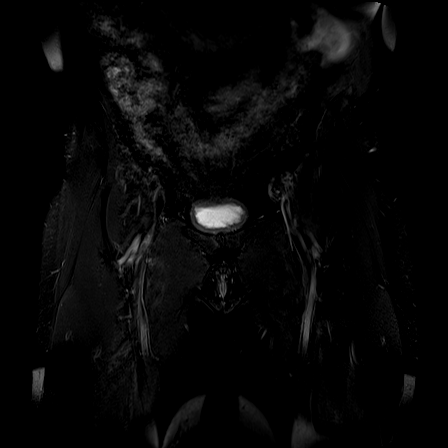

[Series 9: T1 · coronal · left · 3.0mm · 0.89mm/px · 2 of 30 slices shown]
[im 1/30]
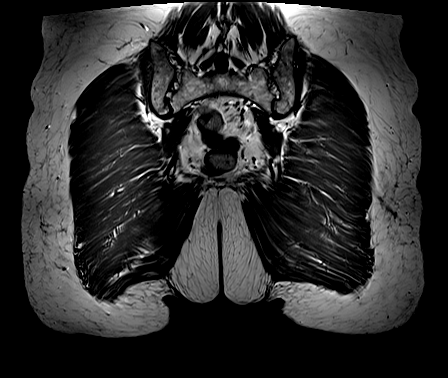
[im 5/30]
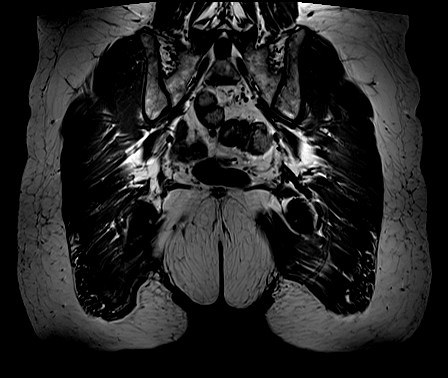

[Series 10: T2 fat-sat · axial · left · 3.0mm · 1.19mm/px · z∈[-104,-3]mm · 8 of 29 slices shown (2 of 2)]
[im 1/29]
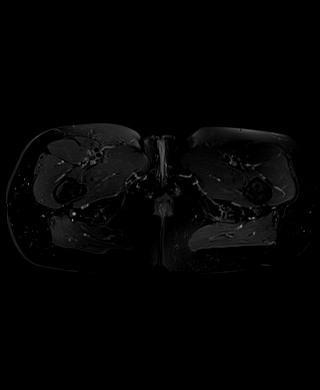
[im 5/29]
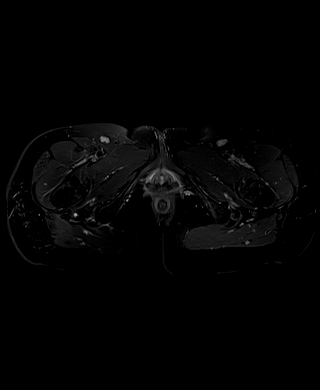
[im 9/29]
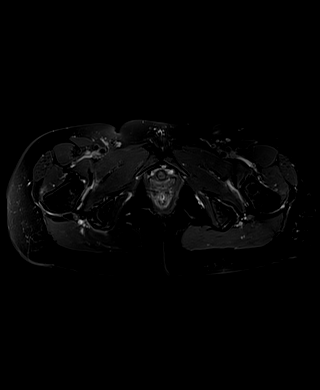
[im 13/29]
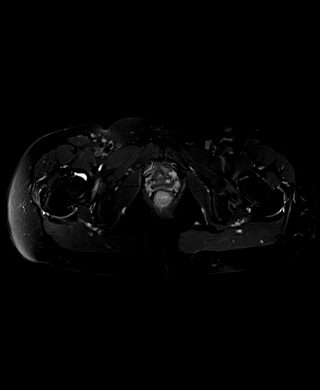
[im 17/29]
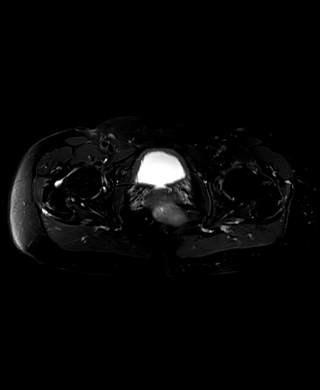
[im 21/29]
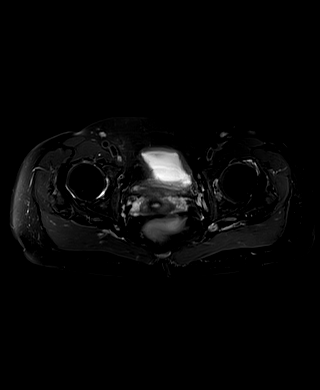
[im 25/29]
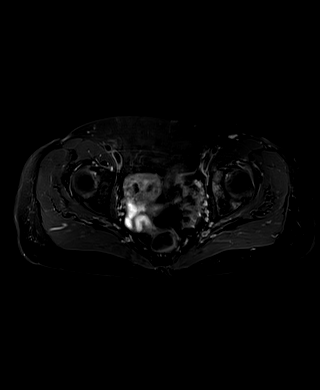
[im 29/29]
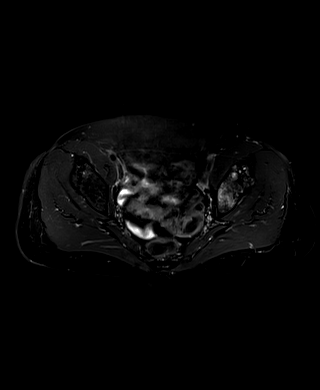

[Series 11: PD fat-sat · coronal · left · 3.0mm · 0.56mm/px · 8 of 28 slices shown (1 of 2)]
[im 1/28]
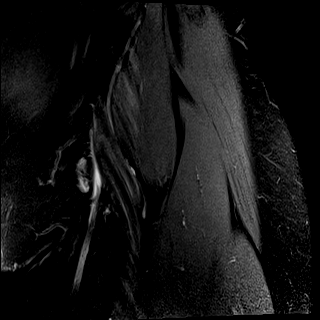
[im 4/28]
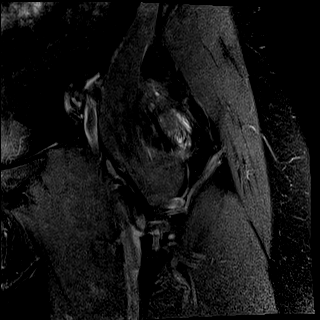
[im 8/28]
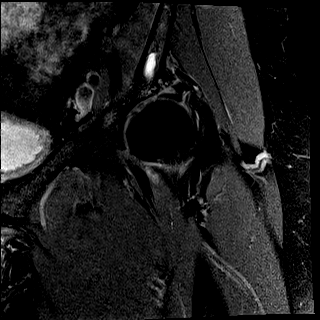
[im 12/28]
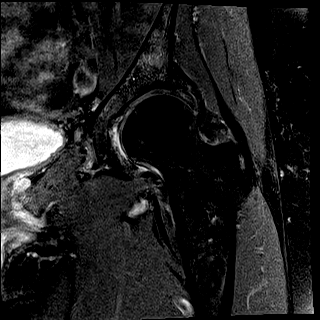
[im 16/28]
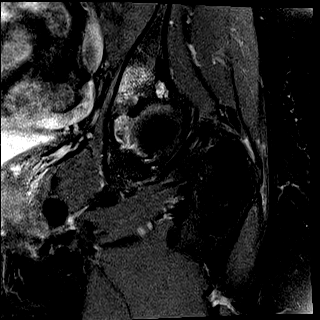
[im 20/28]
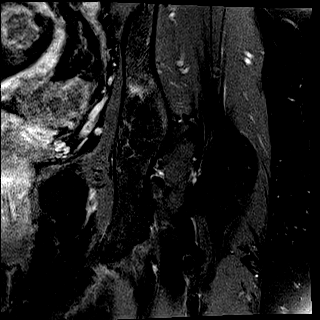
[im 24/28]
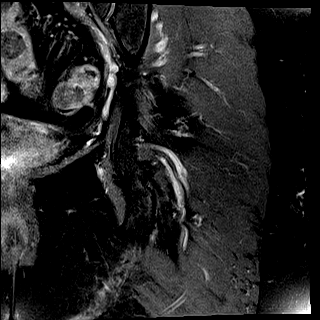
[im 28/28]
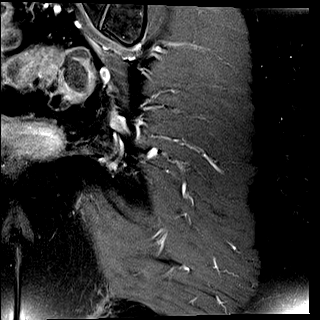

[Series 12: PD fat-sat · sagittal · left · 3.0mm · 0.56mm/px · 8 of 29 slices shown (2 of 2)]
[im 1/29]
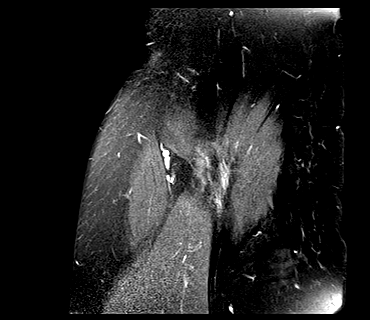
[im 5/29]
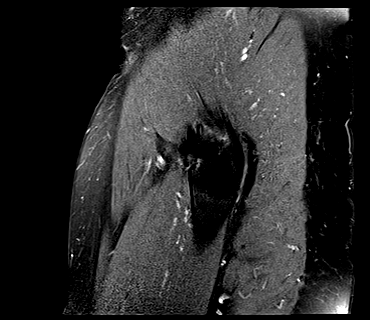
[im 9/29]
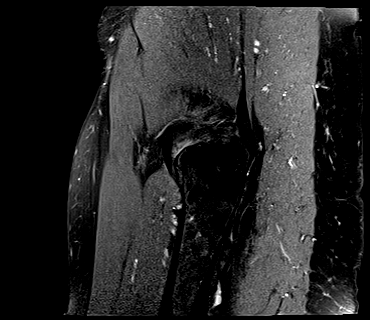
[im 13/29]
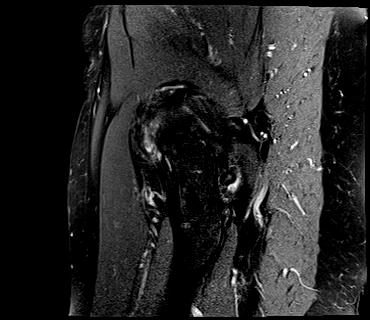
[im 17/29]
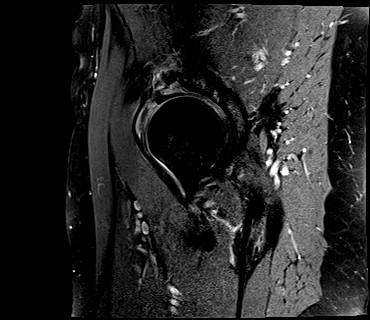
[im 21/29]
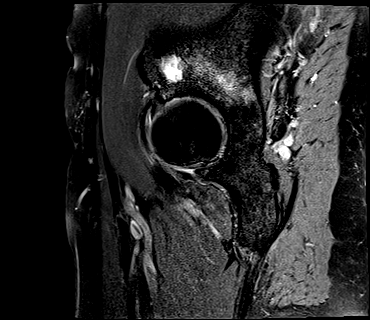
[im 25/29]
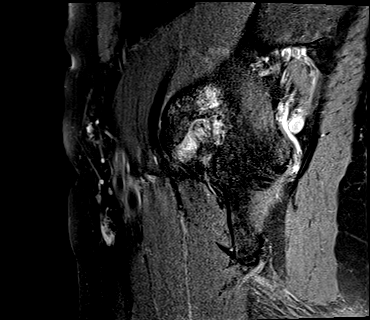
[im 29/29]
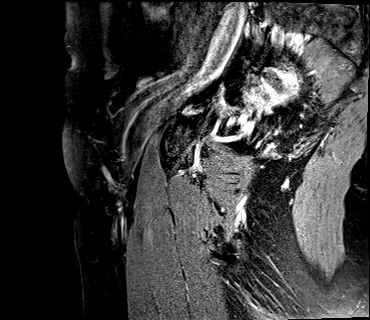

[34 of 40 positions shown; findings below may reference images not displayed]

FINDINGS: Bones: Asymmetric degenerative subcortical cystic lesions along the
left acetabulum compared to the right likely representing
degenerative subcortical cysts and/or geodes. The infiltrative
marrow edema along the acetabulum particularly along the acetabular
roof and posterosuperior acetabulum. The findings in the acetabulum
are disproportionate to the findings in the humeral head.

Fragmented osteophyte of the left anterior superior acetabulum noted
with suspicion for associated acetabular labral tear along the
anterior superior acetabulum for example images 7-11 of series 11.

Lesser degenerative findings are present along the right acetabulum
including some localized degenerative findings in the right anterior
superior acetabulum on image 23 series 8.

Low-level marrow edema potentially reflecting type 1 degenerative
endplate findings eccentric to the left at the L5-S1 level, without
abnormal edema within the intervertebral disc.

Articular cartilage and labrum

Articular cartilage:  Mild degenerative chondral thinning.

Labrum: High suspicion for labral tear along the margins of the
fragmented osteophyte in the anterior superior labrum on the left.

Joint or bursal effusion

Joint effusion:  Absent

Bursae: Trace trochanteric bursitis bilaterally (right greater than
left).

Muscles and tendons

Muscles and tendons:  Unremarkable

Other findings

Miscellaneous: Mild heterogeneity in the uterus, possibly from
uterine fibroids. Sigmoid colon diverticulosis.
IMPRESSION: 1. Chronically fragmented osteophyte from the left anterior superior
acetabulum with suspected associated labral tear.
2. Degenerative subcortical cysts and/or geodes along the left
acetabulum with confluent marrow edema along the acetabular roof
especially posteriorly. Findings in the acetabulum are somewhat
disproportionate to those in the femoral head. No hip joint
effusion.
3. Mild degenerative chondral thinning in both hips.
4. Trace bilateral trochanteric bursitis.
5. Suspected uterine fibroids.
6. Sigmoid colon diverticulosis.
7. Suspected type 1 degenerative endplate findings eccentric to the
left at L5-S1.

## 2022-03-10 NOTE — Progress Notes (Signed)
Cassandra Garner [address] [place] Phone: [phone] Subjective:   Cassandra Garner, am serving as a scribe for Dr. Hulan Garner.  I'm seeing this patient by the request  of:  Cassandra Borg, MD  CC: Right shoulder pain  [id number]  Cassandra Garner is a 66 y.o. female coming in with complaint of R shoulder pain. Seen in 2019 by another provider for RTC issues. Patient states about 4 weeks ago really started hurting. Pain location on the anterior side. Shoulder has been hurting since 2018. Very active. Doesn't take medications but soaks in salt.   Patient did have an x-ray of the right shoulder done in 2018.  This was independently visualized by me showing no bony abnormality.    Past Medical History:  Diagnosis Date   Diverticulitis 01/10/2018   Hypertension    PTSD (post-traumatic stress disorder)    Past Surgical History:  Procedure Laterality Date   PLACEMENT OF BREAST IMPLANTS     known ruptures   RHINOPLASTY     Social History   Socioeconomic History   Marital status: Married    Spouse name: Not on file   Number of children: Not on file   Years of education: Not on file   Highest education level: Not on file  Occupational History   Not on file  Tobacco Use   Smoking status: Never   Smokeless tobacco: Never  Vaping Use   Vaping Use: Never used  Substance and Sexual Activity   Alcohol use: Yes    Comment: 1-2 glass wine per wk   Drug use: Never   Sexual activity: Not Currently    Partners: Male  Other Topics Concern   Not on file  Social History Narrative   Lives with husband      One story home      Right handed      Highest level of edu- bachelors      Works part time   Investment banker, operational of Radio broadcast assistant Strain: Not on file  Food Insecurity: Not on file  Transportation Needs: Not on file  Physical Activity: Not on file  Stress: Not on file  Social Connections: Not on file    Allergies  Allergen Reactions   Amlodipine     Dizzy, palpitations   Other Other (See Comments)   Penicillins Other (See Comments)    Has patient had a PCN reaction causing immediate rash, facial/tongue/throat swelling, SOB or lightheadedness with hypotension: Unknown Has patient had a PCN reaction causing severe rash involving mucus membranes or skin necrosis: No Has patient had a PCN reaction that required hospitalization: No Has patient had a PCN reaction occurring within the last 10 years: No If all of the above answers are "NO", then may proceed with Cephalosporin use.    Albuterol Palpitations   Sulfa Antibiotics Rash   Family History  Problem Relation Age of Onset   Parkinson's disease Mother    Hypertension Father      Current Outpatient Medications (Cardiovascular):    benazepril (LOTENSIN) 40 MG tablet, TAKE 1 TABLET BY MOUTH EVERY DAY. APPOINTMENT DUE IN [place]    Current Outpatient Medications (Hematological):    vitamin B-12 (CYANOCOBALAMIN) 100 MCG tablet, Take 100 mcg by mouth daily.  Current Outpatient Medications (Other):    cholecalciferol (VITAMIN D3) 25 MCG (1000 UT) tablet, Take 1,000 Units by mouth daily.   co-enzyme Q-10 30 MG capsule, Take 30 mg by  mouth daily.   Multiple Vitamins-Minerals (MULTIVITAMIN ADULT) TABS, Take 1 tablet by mouth daily.   mupirocin ointment (BACTROBAN) 2 %, Apply 1 application. topically 2 (two) times daily.   Oregano 1500 MG CAPS, Take 1 capsule by mouth daily.   vitamin C (ASCORBIC ACID) 500 MG tablet, Take 500 mg by mouth daily.   Reviewed prior external information including notes and imaging from  primary care provider As well as notes that were available from care everywhere and other healthcare systems.  Past medical history, social, surgical and family history all reviewed in electronic medical record.  No pertanent information unless stated regarding to the chief complaint.   Review of Systems:  No headache,  visual changes, nausea, vomiting, diarrhea, constipation, dizziness, abdominal pain, skin rash, fevers, chills, night sweats, weight loss, swollen lymph nodes, body aches, joint swelling, chest pain, shortness of breath, mood changes. POSITIVE muscle aches  Objective  Blood pressure (!) 154/98, pulse 93, height '5\' 4"'$  (1.626 m), weight 136 lb (61.7 kg), SpO2 94 %.   General: No apparent distress alert and oriented x3 mood and affect normal, dressed appropriately.  HEENT: Pupils equal, extraocular movements intact  Respiratory: Patient's speak in full sentences and does not appear short of breath  Cardiovascular: No lower extremity edema, non tender, no erythema  Shoulders exam shows patient does have weakness with 3 out of 5 strength of the rotator cuff with empty can.  Patient has weakness with internal and external range of motion as well noted compared to the contralateral side.  Patient has passive near full range of motion but does have a positive O'Brien's.  Limited muscular skeletal ultrasound was performed and interpreted by Cassandra Garner, M  Patient with bicep tendon does have hypoechoic changes within the tendon sheath but no true tear of the biceps noted.  Patient noted the anterior labrum has significant hypoechoic changes as well consistent with an anterior labral tear.  Patient supraspinatus has some degenerative changes that appear to have an acute on chronic likely supraspinatus tear.  Difficult to tell degree tear. 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

## 2022-03-11 ENCOUNTER — Ambulatory Visit: Payer: Self-pay

## 2022-03-11 ENCOUNTER — Ambulatory Visit (INDEPENDENT_AMBULATORY_CARE_PROVIDER_SITE_OTHER): Payer: BC Managed Care – PPO | Admitting: Family Medicine

## 2022-03-11 ENCOUNTER — Ambulatory Visit (INDEPENDENT_AMBULATORY_CARE_PROVIDER_SITE_OTHER): Payer: BC Managed Care – PPO

## 2022-03-11 ENCOUNTER — Encounter: Payer: Self-pay | Admitting: Family Medicine

## 2022-03-11 VITALS — BP 154/98 | HR 93 | Ht 64.0 in | Wt 136.0 lb

## 2022-03-11 DIAGNOSIS — M75111 Incomplete rotator cuff tear or rupture of right shoulder, not specified as traumatic: Secondary | ICD-10-CM | POA: Diagnosis not present

## 2022-03-11 DIAGNOSIS — M25511 Pain in right shoulder: Secondary | ICD-10-CM

## 2022-03-11 NOTE — Addendum Note (Signed)
Addended by: Vilma Meckel R on: 03/11/2022 01:13 PM   Modules accepted: Orders

## 2022-03-11 NOTE — Assessment & Plan Note (Signed)
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  In addition to this, it does appear that patient has possible anterior labral tear but difficult to assess on the ultrasound.  Patient has done physical therapy previously.  Last x-rays were 5 years ago so we will get new ones.  Due to the severe weakness symptoms affecting daily activities and waking her up did not feel good and some quality of muscle any atrophy that would make patient feel better or not candidate for surgical intervention.  Patient will have the MRI and will need to discuss further after imaging.

## 2022-03-11 NOTE — Patient Instructions (Addendum)
Xray today  [place] [phone] Call Today  When we receive your results we will contact you.  Do prescribed exercises at least 3x a week

## 2022-03-24 ENCOUNTER — Ambulatory Visit
Admission: RE | Admit: 2022-03-24 | Discharge: 2022-03-24 | Disposition: A | Discharge status: Home / Self Care | Payer: BC Managed Care – PPO | Source: Ambulatory Visit | Attending: Family Medicine | Admitting: Family Medicine

## 2022-03-24 DIAGNOSIS — M75111 Incomplete rotator cuff tear or rupture of right shoulder, not specified as traumatic: Secondary | ICD-10-CM

## 2022-03-24 DIAGNOSIS — S46011A Strain of muscle(s) and tendon(s) of the rotator cuff of right shoulder, initial encounter: Secondary | ICD-10-CM | POA: Diagnosis not present

## 2022-03-24 DIAGNOSIS — M25611 Stiffness of right shoulder, not elsewhere classified: Secondary | ICD-10-CM | POA: Diagnosis not present

## 2022-03-24 MED ORDER — IOPAMIDOL (ISOVUE-M 200) INJECTION 41%
15.0000 mL | Freq: Once | INTRAMUSCULAR | Status: AC
  Administered 2022-03-24: 15 mL via INTRA_ARTICULAR

## 2022-03-30 ENCOUNTER — Encounter: Payer: Self-pay | Admitting: Family Medicine

## 2022-03-30 ENCOUNTER — Ambulatory Visit (INDEPENDENT_AMBULATORY_CARE_PROVIDER_SITE_OTHER): Payer: BC Managed Care – PPO | Admitting: Family Medicine

## 2022-03-30 VITALS — BP 126/84 | HR 66 | Ht 64.0 in | Wt 136.0 lb

## 2022-03-30 DIAGNOSIS — M75111 Incomplete rotator cuff tear or rupture of right shoulder, not specified as traumatic: Secondary | ICD-10-CM

## 2022-03-30 NOTE — Patient Instructions (Signed)
Referral Tamera Punt Write [place] with questions If you decided on PRP we can make an appointment then

## 2022-03-30 NOTE — Assessment & Plan Note (Addendum)
Discussed with patient and significant other at great length.  Total time 36 minutes.  Discussed the different treatment options including the PRP as well as the possibility for surgical intervention.  Due to the number of different small partial tears noted of the rotator cuff I think that surgical intervention may be more beneficial at this time.  Referred to orthopedic surgery to further evaluate.

## 2022-03-30 NOTE — Progress Notes (Signed)
[place] Phone: [phone] Subjective:   Fontaine No, am serving as a scribe for Dr. Hulan Saas.  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  In addition to this, it does appear that patient has possible anterior labral tear but difficult to assess on the ultrasound.  Patient has done physical therapy previously.  Last x-rays were 5 years ago so we will get new ones.  Due to the severe weakness symptoms affecting daily activities and waking her up did not feel good and some quality of muscle any atrophy that would make patient feel better or not candidate for surgical intervention.  Patient will have the MRI and will need to discuss further after imaging.  Update 03/30/2022 Alyshia Kernan is a 66 y.o. female coming in with complaint of R shoulder pain. Patient states that her shoulder is getting stronger with the HEP.    MRI R shoulder 03/24/2022 IMPRESSION: 1. Severe rotator cuff tendinosis with high-grade partial-thickness tears involving the supraspinatus, infraspinatus, and subscapularis tendons. No extension of injected contrast into the subacromial-subdeltoid bursal space to suggest a full-thickness component. 2. Intra-articular biceps tendinosis with short segment longitudinal split tear. 3. Full-thickness chondral fissuring of the humeral head centrally with nondisplaced cartilage delamination.       Past Medical History:  Diagnosis Date   Diverticulitis 01/10/2018   Hypertension    PTSD (post-traumatic stress disorder)    Past Surgical History:  Procedure Laterality Date   PLACEMENT OF BREAST IMPLANTS     known ruptures   RHINOPLASTY     Social History   Socioeconomic History   Marital status: Married    Spouse name: Not on file    Number of children: Not on file   Years of education: Not on file   Highest education level: Not on file  Occupational History   Not on file  Tobacco Use   Smoking status: Never   Smokeless tobacco: Never  Vaping Use   Vaping Use: Never used  Substance and Sexual Activity   Alcohol use: Yes    Comment: 1-2 glass wine per wk   Drug use: Never   Sexual activity: Not Currently    Partners: Male  Other Topics Concern   Not on file  Social History Narrative   Lives with husband      One story home      Right handed      Highest level of edu- bachelors      Works part time   Investment banker, operational of Radio broadcast assistant Strain: Not on file  Food Insecurity: Not on file  Transportation Needs: Not on file  Physical Activity: Not on file  Stress: Not on file  Social Connections: Not on file   Allergies  Allergen Reactions   Amlodipine     Dizzy, palpitations   Other Other (See Comments)   Penicillins Other (See Comments)    Has patient had a PCN reaction causing immediate rash, facial/tongue/throat swelling, SOB or lightheadedness with hypotension: Unknown Has patient had a PCN reaction causing severe rash involving mucus membranes or skin necrosis: No Has patient had a PCN reaction that required hospitalization: No Has patient had a PCN reaction occurring within the last 10 years: No If all of the  above answers are "NO", then may proceed with Cephalosporin use.    Albuterol Palpitations   Sulfa Antibiotics Rash   Family History  Problem Relation Age of Onset   Parkinson's disease Mother    Hypertension Father      Current Outpatient Medications (Cardiovascular):    benazepril (LOTENSIN) 40 MG tablet, TAKE 1 TABLET BY MOUTH EVERY DAY. APPOINTMENT DUE IN [place]    Current Outpatient Medications (Hematological):    vitamin B-12 (CYANOCOBALAMIN) 100 MCG tablet, Take 100 mcg by mouth daily.  Current Outpatient Medications (Other):    cholecalciferol  (VITAMIN D3) 25 MCG (1000 UT) tablet, Take 1,000 Units by mouth daily.   co-enzyme Q-10 30 MG capsule, Take 30 mg by mouth daily.   Multiple Vitamins-Minerals (MULTIVITAMIN ADULT) TABS, Take 1 tablet by mouth daily.   mupirocin ointment (BACTROBAN) 2 %, Apply 1 application. topically 2 (two) times daily.   Oregano 1500 MG CAPS, Take 1 capsule by mouth daily.   vitamin C (ASCORBIC ACID) 500 MG tablet, Take 500 mg by mouth daily.    Review of Systems:  No headache, visual changes, nausea, vomiting, diarrhea, constipation, dizziness, abdominal pain, skin rash, fevers, chills, night sweats, weight loss, swollen lymph nodes, body aches, joint swelling, chest pain, shortness of breath, mood changes. POSITIVE muscle aches  Objective  Blood pressure 126/84, pulse 66, height '5\' 4"'$  (1.626 m), weight 136 lb (61.7 kg), SpO2 97 %.   General: No apparent distress alert and oriented x3 mood and affect normal, dressed appropriately.  HEENT: Pupils equal, extraocular movements intact  Respiratory: Patient's speak in full sentences and does not appear short of breath  Cardiovascular: No lower extremity edema, non tender, no erythema  Left shoulder exam does have some tenderness to palpation.  Mild positive impingement noted.  Rotator cuff strength 4 out of 5.    Impression and Recommendations:     The above documentation has been reviewed and is accurate and complete Lyndal Pulley, DO

## 2022-04-14 DIAGNOSIS — Z1231 Encounter for screening mammogram for malignant neoplasm of breast: Secondary | ICD-10-CM | POA: Diagnosis not present

## 2022-04-14 DIAGNOSIS — Z0000 Encounter for general adult medical examination without abnormal findings: Secondary | ICD-10-CM | POA: Diagnosis not present

## 2022-04-14 DIAGNOSIS — K649 Unspecified hemorrhoids: Secondary | ICD-10-CM | POA: Diagnosis not present

## 2022-04-14 DIAGNOSIS — Z0001 Encounter for general adult medical examination with abnormal findings: Secondary | ICD-10-CM | POA: Diagnosis not present

## 2022-04-14 DIAGNOSIS — E039 Hypothyroidism, unspecified: Secondary | ICD-10-CM | POA: Diagnosis not present

## 2022-04-14 DIAGNOSIS — M8580 Other specified disorders of bone density and structure, unspecified site: Secondary | ICD-10-CM | POA: Diagnosis not present

## 2022-04-14 DIAGNOSIS — K5790 Diverticulosis of intestine, part unspecified, without perforation or abscess without bleeding: Secondary | ICD-10-CM | POA: Insufficient documentation

## 2022-04-14 DIAGNOSIS — I10 Essential (primary) hypertension: Secondary | ICD-10-CM | POA: Diagnosis not present

## 2022-04-19 DIAGNOSIS — M75121 Complete rotator cuff tear or rupture of right shoulder, not specified as traumatic: Secondary | ICD-10-CM | POA: Diagnosis not present

## 2022-05-04 DIAGNOSIS — M25774 Osteophyte, right foot: Secondary | ICD-10-CM | POA: Diagnosis not present

## 2022-05-04 DIAGNOSIS — M24111 Other articular cartilage disorders, right shoulder: Secondary | ICD-10-CM | POA: Diagnosis not present

## 2022-05-04 DIAGNOSIS — M7581 Other shoulder lesions, right shoulder: Secondary | ICD-10-CM | POA: Diagnosis not present

## 2022-05-04 DIAGNOSIS — M7541 Impingement syndrome of right shoulder: Secondary | ICD-10-CM | POA: Diagnosis not present

## 2022-05-04 DIAGNOSIS — S46011A Strain of muscle(s) and tendon(s) of the rotator cuff of right shoulder, initial encounter: Secondary | ICD-10-CM | POA: Diagnosis not present

## 2022-05-28 ENCOUNTER — Other Ambulatory Visit: Payer: Self-pay

## 2022-05-28 MED ORDER — BENAZEPRIL HCL 40 MG PO TABS: ORAL_TABLET | ORAL | 3 refills | Status: DC

## 2022-05-28 NOTE — Progress Notes (Signed)
Received incoming rx request fax from [facility]. Rx sent

## 2022-08-04 ENCOUNTER — Ambulatory Visit (INDEPENDENT_AMBULATORY_CARE_PROVIDER_SITE_OTHER): Payer: Medicare Other | Admitting: Internal Medicine

## 2022-08-04 VITALS — BP 128/76 | HR 76 | Ht 64.0 in | Wt 135.0 lb

## 2022-08-04 DIAGNOSIS — E538 Deficiency of other specified B group vitamins: Secondary | ICD-10-CM

## 2022-08-04 DIAGNOSIS — E039 Hypothyroidism, unspecified: Secondary | ICD-10-CM | POA: Diagnosis not present

## 2022-08-04 DIAGNOSIS — I10 Essential (primary) hypertension: Secondary | ICD-10-CM | POA: Diagnosis not present

## 2022-08-04 DIAGNOSIS — E559 Vitamin D deficiency, unspecified: Secondary | ICD-10-CM | POA: Diagnosis not present

## 2022-08-04 DIAGNOSIS — R739 Hyperglycemia, unspecified: Secondary | ICD-10-CM

## 2022-08-04 DIAGNOSIS — E2839 Other primary ovarian failure: Secondary | ICD-10-CM | POA: Diagnosis not present

## 2022-08-04 LAB — CBC WITH DIFFERENTIAL/PLATELET
Basophils Absolute: 0 10*3/uL (ref 0.0–0.1)
Basophils Relative: 0.5 % (ref 0.0–3.0)
Eosinophils Absolute: 0.1 10*3/uL (ref 0.0–0.7)
Eosinophils Relative: 2.5 % (ref 0.0–5.0)
HCT: 43.6 % (ref 36.0–46.0)
Hemoglobin: 14.6 g/dL (ref 12.0–15.0)
Lymphocytes Relative: 30.8 % (ref 12.0–46.0)
Lymphs Abs: 1.6 10*3/uL (ref 0.7–4.0)
MCHC: 33.4 g/dL (ref 30.0–36.0)
MCV: 90.1 fl (ref 78.0–100.0)
Monocytes Absolute: 0.3 10*3/uL (ref 0.1–1.0)
Monocytes Relative: 6 % (ref 3.0–12.0)
Neutro Abs: 3.2 10*3/uL (ref 1.4–7.7)
Neutrophils Relative %: 60.2 % (ref 43.0–77.0)
Platelets: 256 10*3/uL (ref 150.0–400.0)
RBC: 4.85 Mil/uL (ref 3.87–5.11)
RDW: 13 % (ref 11.5–15.5)
WBC: 5.3 10*3/uL (ref 4.0–10.5)

## 2022-08-04 LAB — URINALYSIS, ROUTINE W REFLEX MICROSCOPIC
Bilirubin Urine: NEGATIVE
Hgb urine dipstick: NEGATIVE
Ketones, ur: NEGATIVE
Leukocytes,Ua: NEGATIVE
Nitrite: NEGATIVE
Specific Gravity, Urine: 1.02 (ref 1.000–1.030)
Total Protein, Urine: NEGATIVE
Urine Glucose: NEGATIVE
Urobilinogen, UA: 0.2 (ref 0.0–1.0)
pH: 7 (ref 5.0–8.0)

## 2022-08-04 LAB — LIPID PANEL
Cholesterol: 167 mg/dL (ref 0–200)
HDL: 66.9 mg/dL (ref >39.00)
LDL Cholesterol: 74 mg/dL (ref 0–99)
NonHDL: 100.1
Total CHOL/HDL Ratio: 2
Triglycerides: 132 mg/dL (ref 0.0–149.0)
VLDL: 26.4 mg/dL (ref 0.0–40.0)

## 2022-08-04 LAB — BASIC METABOLIC PANEL
BUN: 10 mg/dL (ref 6–23)
CO2: 30 mEq/L (ref 19–32)
Calcium: 9 mg/dL (ref 8.4–10.5)
Chloride: 105 mEq/L (ref 96–112)
Creatinine, Ser: 0.7 mg/dL (ref 0.40–1.20)
GFR: 90.97 mL/min (ref >60.00)
Glucose, Bld: 90 mg/dL (ref 70–99)
Potassium: 3.9 mEq/L (ref 3.5–5.1)
Sodium: 141 mEq/L (ref 135–145)

## 2022-08-04 LAB — HEPATIC FUNCTION PANEL
ALT: 15 U/L (ref 0–35)
AST: 19 U/L (ref 0–37)
Albumin: 4.2 g/dL (ref 3.5–5.2)
Alkaline Phosphatase: 66 U/L (ref 39–117)
Bilirubin, Direct: 0.1 mg/dL (ref 0.0–0.3)
Total Bilirubin: 0.6 mg/dL (ref 0.2–1.2)
Total Protein: 6.7 g/dL (ref 6.0–8.3)

## 2022-08-04 LAB — HEMOGLOBIN A1C: Hgb A1c MFr Bld: 5.6 % (ref 4.6–6.5)

## 2022-08-04 LAB — TSH: TSH: 4.01 u[IU]/mL (ref 0.35–5.50)

## 2022-08-04 LAB — VITAMIN B12: Vitamin B-12: 441 pg/mL (ref 211–911)

## 2022-08-04 LAB — VITAMIN D 25 HYDROXY (VIT D DEFICIENCY, FRACTURES): VITD: 47 ng/mL (ref 30.00–100.00)

## 2022-08-04 MED ORDER — BENAZEPRIL HCL 40 MG PO TABS: ORAL_TABLET | ORAL | 3 refills | Status: DC

## 2022-08-04 NOTE — Progress Notes (Signed)
Patient ID: Cassandra Garner, female   DOB: [birth year], 66 y.o.   MRN: [id number]         Chief Complaint:: yearly exam        HPI:  Cassandra Garner is a 66 y.o. female here overall doing ok, Pt denies chest pain, increased sob or doe, wheezing, orthopnea, PND, increased LE swelling, palpitations, dizziness or syncope.   Pt denies polydipsia, polyuria, or new focal neuro s/s.    Pt denies fever, wt loss, night sweats, loss of appetite, or other constitutional symptoms  Also s/p right shoulder surgury sept 12, improving still with PT and swimming.  Due for Dxa, med refills, labs. No other new complaints   Wt Readings from Last 3 Encounters:  08/04/22 135 lb (61.2 kg)  03/30/22 136 lb (61.7 kg)  03/11/22 136 lb (61.7 kg)   BP Readings from Last 3 Encounters:  08/04/22 128/76  03/30/22 126/84  03/11/22 (!) 154/98   Immunization History  Administered Date(s) Administered   Tdap 04/19/2008, 07/10/2018   There are no preventive care reminders to display for this patient.     Past Medical History:  Diagnosis Date   Diverticulitis 01/10/2018   Hypertension    PTSD (post-traumatic stress disorder)    Past Surgical History:  Procedure Laterality Date   PLACEMENT OF BREAST IMPLANTS     known ruptures   RHINOPLASTY      reports that she has never smoked. She has never used smokeless tobacco. She reports current alcohol use. She reports that she does not use drugs. family history includes Hypertension in her father; Parkinson's disease in her mother. Allergies  Allergen Reactions   Amlodipine     Dizzy, palpitations   Other Other (See Comments)   Penicillins Other (See Comments)    Has patient had a PCN reaction causing immediate rash, facial/tongue/throat swelling, SOB or lightheadedness with hypotension: Unknown Has patient had a PCN reaction causing severe rash involving mucus membranes or skin necrosis: No Has patient had a PCN reaction that required hospitalization: No Has  patient had a PCN reaction occurring within the last 10 years: No If all of the above answers are "NO", then may proceed with Cephalosporin use.    Albuterol Palpitations   Sulfa Antibiotics Rash   Current Outpatient Medications on File Prior to Visit  Medication Sig Dispense Refill   cholecalciferol (VITAMIN D3) 25 MCG (1000 UT) tablet Take 1,000 Units by mouth daily.     co-enzyme Q-10 30 MG capsule Take 30 mg by mouth daily.     Multiple Vitamins-Minerals (MULTIVITAMIN ADULT) TABS Take 1 tablet by mouth daily.     mupirocin ointment (BACTROBAN) 2 % Apply 1 application. topically 2 (two) times daily. 22 g 0   Oregano 1500 MG CAPS Take 1 capsule by mouth daily.     vitamin B-12 (CYANOCOBALAMIN) 100 MCG tablet Take 100 mcg by mouth daily.     vitamin C (ASCORBIC ACID) 500 MG tablet Take 500 mg by mouth daily.     No current facility-administered medications on file prior to visit.        ROS:  All others reviewed and negative.  Objective        PE:  BP 128/76 (BP Location: Left Arm, Patient Position: Sitting, Cuff Size: Large)   Pulse 76   Ht '5\' 4"'$  (1.626 m)   Wt 135 lb (61.2 kg)   SpO2 97%   BMI 23.17 kg/m  Constitutional: Pt appears in NAD               HENT: Head: NCAT.                Right Ear: External ear normal.                 Left Ear: External ear normal.                Eyes: . Pupils are equal, round, and reactive to light. Conjunctivae and EOM are normal               Nose: without d/c or deformity               Neck: Neck supple. Gross normal ROM               Cardiovascular: Normal rate and regular rhythm.                 Pulmonary/Chest: Effort normal and breath sounds without rales or wheezing.                Abd:  Soft, NT, ND, + BS, no organomegaly               Neurological: Pt is alert. At baseline orientation, motor grossly intact               Skin: Skin is warm. No rashes, no other new lesions, LE edema - none               Psychiatric:  Pt behavior is normal without agitation   Micro: none  Cardiac tracings I have personally interpreted today:  none  Pertinent Radiological findings (summarize): none   Lab Results  Component Value Date   WBC 5.3 08/04/2022   HGB 14.6 08/04/2022   HCT 43.6 08/04/2022   PLT 256.0 08/04/2022   GLUCOSE 90 08/04/2022   CHOL 167 08/04/2022   TRIG 132.0 08/04/2022   HDL 66.90 08/04/2022   LDLCALC 74 08/04/2022   ALT 15 08/04/2022   AST 19 08/04/2022   NA 141 08/04/2022   K 3.9 08/04/2022   CL 105 08/04/2022   CREATININE 0.70 08/04/2022   BUN 10 08/04/2022   CO2 30 08/04/2022   TSH 4.01 08/04/2022   HGBA1C 5.6 08/04/2022   Assessment/Plan:  Cassandra Garner is a 66 y.o. White or Caucasian [1] female with  has a past medical history of Diverticulitis (01/10/2018), Hypertension, and PTSD (post-traumatic stress disorder).  Essential (primary) hypertension BP Readings from Last 3 Encounters:  08/04/22 128/76  03/30/22 126/84  03/11/22 (!) 154/98   Stable, pt to continue medical treatment lotensin 40 mg qd   Hyperglycemia Lab Results  Component Value Date   HGBA1C 5.6 08/04/2022   Stable, pt to continue current medical treatment  - diet, wt control, excercise   Hypothyroidism, unspecified Lab Results  Component Value Date   TSH 4.01 08/04/2022   Stable, pt to continue same tx, does not require oral replacement at this time  Estrogen deficiency Due for screening DXA, to take oscal plus D asd  Followup: Return in about 1 year (around 08/05/2023).  Cathlean Cower, MD 08/07/2022 7:05 AM [facility]

## 2022-08-04 NOTE — Patient Instructions (Addendum)
Please schedule the bone density test before leaving today at the scheduling desk (where you check out), to be done at [place] basement xray dept  Please continue all other medications as before, and refills have been done if requested.  Please have the pharmacy call with any other refills you may need.  Please continue your efforts at being more active, low cholesterol diet, and weight control.  You are otherwise up to date with prevention measures today.  Please keep your appointments with your specialists as you may have planned  Please go to the LAB at the blood drawing area for the tests to be done  You will be contacted by phone if any changes need to be made immediately.  Otherwise, you will receive a letter about your results with an explanation, but please check with MyChart first.  Please remember to sign up for MyChart if you have not done so, as this will be important to you in the future with finding out test results, communicating by private email, and scheduling acute appointments online when needed.  Please make an Appointment to return for your 1 year visit, or sooner if needed

## 2022-08-05 ENCOUNTER — Ambulatory Visit (INDEPENDENT_AMBULATORY_CARE_PROVIDER_SITE_OTHER)
Admission: RE | Admit: 2022-08-05 | Discharge: 2022-08-05 | Disposition: A | Discharge status: Home / Self Care | Payer: Medicare Other | Source: Ambulatory Visit | Attending: Internal Medicine | Admitting: Internal Medicine

## 2022-08-05 DIAGNOSIS — E2839 Other primary ovarian failure: Secondary | ICD-10-CM

## 2022-08-07 ENCOUNTER — Encounter: Payer: Self-pay | Admitting: Internal Medicine

## 2022-08-07 DIAGNOSIS — E2839 Other primary ovarian failure: Secondary | ICD-10-CM | POA: Insufficient documentation

## 2022-08-07 NOTE — Assessment & Plan Note (Signed)
Lab Results  Component Value Date   HGBA1C 5.6 08/04/2022   Stable, pt to continue current medical treatment  - diet, wt control, excercise

## 2022-08-07 NOTE — Assessment & Plan Note (Signed)
Lab Results  Component Value Date   TSH 4.01 08/04/2022   Stable, pt to continue same tx, does not require oral replacement at this time

## 2022-08-07 NOTE — Assessment & Plan Note (Signed)
Due for screening DXA, to take oscal plus D asd

## 2022-08-07 NOTE — Assessment & Plan Note (Signed)
BP Readings from Last 3 Encounters:  08/04/22 128/76  03/30/22 126/84  03/11/22 (!) 154/98   Stable, pt to continue medical treatment lotensin 40 mg qd

## 2022-08-08 ENCOUNTER — Other Ambulatory Visit: Payer: Self-pay | Admitting: Internal Medicine

## 2022-08-08 MED ORDER — ALENDRONATE SODIUM 70 MG PO TABS: 70.0000 mg | ORAL_TABLET | ORAL | 3 refills | Status: DC

## 2022-09-24 ENCOUNTER — Ambulatory Visit (INDEPENDENT_AMBULATORY_CARE_PROVIDER_SITE_OTHER): Payer: Medicare Other | Admitting: Internal Medicine

## 2022-09-24 ENCOUNTER — Encounter: Payer: Self-pay | Admitting: Internal Medicine

## 2022-09-24 VITALS — BP 136/78 | HR 90 | Temp 98.3°F | Ht 64.0 in | Wt 132.0 lb

## 2022-09-24 DIAGNOSIS — R739 Hyperglycemia, unspecified: Secondary | ICD-10-CM

## 2022-09-24 DIAGNOSIS — I10 Essential (primary) hypertension: Secondary | ICD-10-CM | POA: Diagnosis not present

## 2022-09-24 DIAGNOSIS — R221 Localized swelling, mass and lump, neck: Secondary | ICD-10-CM | POA: Diagnosis not present

## 2022-09-24 NOTE — Patient Instructions (Signed)
Ok to watch the nodule for now, and call for ENT referral for any worsening  Please continue all other medications as before, and refills have been done if requested.  Please have the pharmacy call with any other refills you may need.  Please keep your appointments with your specialists as you may have planned

## 2022-09-24 NOTE — Assessment & Plan Note (Signed)
Lab Results  Component Value Date   HGBA1C 5.6 08/04/2022   Stable, pt to continue current medical treatment  - diet, wt control

## 2022-09-24 NOTE — Assessment & Plan Note (Signed)
BP Readings from Last 3 Encounters:  09/24/22 136/78  08/04/22 128/76  03/30/22 126/84   Stable, pt to continue medical treatment lotensin 40 mg qd

## 2022-09-24 NOTE — Assessment & Plan Note (Signed)
Appears benign, encouraged pt to continue to follow but to call for ENT referral if any wrosening

## 2022-09-24 NOTE — Progress Notes (Signed)
Patient ID: Cassandra Garner, female   DOB: April 23, 1956, 67 y.o.   MRN: [id number]        Chief Complaint: follow up left neck lump, htn, hyperglycemia       HPI:  Cassandra Garner is a 67 y.o. female here with c/o 1 wk noticed subq skin nodule lump to left neck, non tender mobile and no overlying skin change, no fever, chills or other noted lumps.  Found herself with just massaging the neck.  Has been stable in the week she has been aware.  Pt denies chest pain, increased sob or doe, wheezing, orthopnea, PND, increased LE swelling, palpitations, dizziness or syncope.   Pt denies polydipsia, polyuria, or new focal neuro s/s.          Wt Readings from Last 3 Encounters:  09/24/22 132 lb (59.9 kg)  08/04/22 135 lb (61.2 kg)  03/30/22 136 lb (61.7 kg)   BP Readings from Last 3 Encounters:  09/24/22 136/78  08/04/22 128/76  03/30/22 126/84         Past Medical History:  Diagnosis Date   Diverticulitis 01/10/2018   Hypertension    PTSD (post-traumatic stress disorder)    Past Surgical History:  Procedure Laterality Date   PLACEMENT OF BREAST IMPLANTS     known ruptures   RHINOPLASTY      reports that she has never smoked. She has never used smokeless tobacco. She reports current alcohol use. She reports that she does not use drugs. family history includes Hypertension in her father; Parkinson's disease in her mother. Allergies  Allergen Reactions   Amlodipine     Dizzy, palpitations   Other Other (See Comments)   Penicillins Other (See Comments)    Has patient had a PCN reaction causing immediate rash, facial/tongue/throat swelling, SOB or lightheadedness with hypotension: Unknown Has patient had a PCN reaction causing severe rash involving mucus membranes or skin necrosis: No Has patient had a PCN reaction that required hospitalization: No Has patient had a PCN reaction occurring within the last 10 years: No If all of the above answers are "NO", then may proceed with  Cephalosporin use.    Albuterol Palpitations   Sulfa Antibiotics Rash   Current Outpatient Medications on File Prior to Visit  Medication Sig Dispense Refill   alendronate (FOSAMAX) 70 MG tablet Take 1 tablet (70 mg total) by mouth every 7 (seven) days. Take with a full glass of water on an empty stomach. 12 tablet 3   benazepril (LOTENSIN) 40 MG tablet TAKE 1 TABLET BY MOUTH EVERY DAY. APPOINTMENT DUE IN JUNE 90 tablet 3   cholecalciferol (VITAMIN D3) 25 MCG (1000 UT) tablet Take 1,000 Units by mouth daily.     co-enzyme Q-10 30 MG capsule Take 30 mg by mouth daily.     Multiple Vitamins-Minerals (MULTIVITAMIN ADULT) TABS Take 1 tablet by mouth daily.     mupirocin ointment (BACTROBAN) 2 % Apply 1 application. topically 2 (two) times daily. 22 g 0   Oregano 1500 MG CAPS Take 1 capsule by mouth daily.     vitamin B-12 (CYANOCOBALAMIN) 100 MCG tablet Take 100 mcg by mouth daily.     vitamin C (ASCORBIC ACID) 500 MG tablet Take 500 mg by mouth daily.     No current facility-administered medications on file prior to visit.        ROS:  All others reviewed and negative.  Objective        PE:  BP 136/78 (BP Location: Left  Arm, Patient Position: Sitting, Cuff Size: Large)   Pulse 90   Temp 98.3 F (36.8 C) (Oral)   Ht '5\' 4"'$  (1.626 m)   Wt 132 lb (59.9 kg)   SpO2 95%   BMI 22.66 kg/m                 Constitutional: Pt appears in NAD               HENT: Head: NCAT.                Right Ear: External ear normal.                 Left Ear: External ear normal.                Eyes: . Pupils are equal, round, and reactive to light. Conjunctivae and EOM are normal               Nose: without d/c or deformity               Neck: Neck supple. Gross normal ROM, left mid neck with < 1 cm firm nontender mobile subq smoohthe nodule without other mass or swelling, located approx mid post SCM                Cardiovascular: Normal rate and regular rhythm.                 Pulmonary/Chest: Effort  normal and breath sounds without rales or wheezing.                Abd:  Soft, NT, ND, + BS, no organomegaly               Neurological: Pt is alert. At baseline orientation, motor grossly intact               Skin:  LE edema - none               Psychiatric: Pt behavior is normal without agitation   Micro: none  Cardiac tracings I have personally interpreted today:  none  Pertinent Radiological findings (summarize): none   Lab Results  Component Value Date   WBC 5.3 08/04/2022   HGB 14.6 08/04/2022   HCT 43.6 08/04/2022   PLT 256.0 08/04/2022   GLUCOSE 90 08/04/2022   CHOL 167 08/04/2022   TRIG 132.0 08/04/2022   HDL 66.90 08/04/2022   LDLCALC 74 08/04/2022   ALT 15 08/04/2022   AST 19 08/04/2022   NA 141 08/04/2022   K 3.9 08/04/2022   CL 105 08/04/2022   CREATININE 0.70 08/04/2022   BUN 10 08/04/2022   CO2 30 08/04/2022   TSH 4.01 08/04/2022   HGBA1C 5.6 08/04/2022   Assessment/Plan:  Cassandra Garner is a 67 y.o. White or Caucasian [1] female with  has a past medical history of Diverticulitis (01/10/2018), Hypertension, and PTSD (post-traumatic stress disorder).  Essential (primary) hypertension BP Readings from Last 3 Encounters:  09/24/22 136/78  08/04/22 128/76  03/30/22 126/84   Stable, pt to continue medical treatment lotensin 40 mg qd   Hyperglycemia Lab Results  Component Value Date   HGBA1C 5.6 08/04/2022   Stable, pt to continue current medical treatment  - diet, wt control   Localized swelling, mass and lump, neck Appears benign, encouraged pt to continue to follow but to call for ENT referral if any wrosening  Followup: Return if symptoms worsen or fail to improve.  Jeneen Rinks  Jenny Reichmann, MD 09/24/2022 10:39 PM [facility]

## 2023-02-04 ENCOUNTER — Ambulatory Visit: Payer: BLUE CROSS/BLUE SHIELD | Admitting: Family Medicine

## 2023-02-08 NOTE — Progress Notes (Unsigned)
[facility] [address] [place] [address] Phone: [phone] Subjective:   Cassandra Garner, am serving as a scribe for Dr. Antoine Primas.  I'm seeing this patient by the request  of:  Corwin Levins, MD  CC: Left shoulder pain  [id number]  03/30/2022 Discussed with patient and significant other at great length.  Total time 36 minutes.  Discussed the different treatment options including the PRP as well as the possibility for surgical intervention.  Due to the number of different small partial tears noted of the rotator cuff I think that surgical intervention may be more beneficial at this time.  Referred to orthopedic surgery to further evaluate.      Update 02/09/2023 Cassandra Garner is a 67 y.o. female coming in with complaint of L shoulder pain. Patient states L shoulder pain after starting going back to regular activity after surgery on R. No clicking or popping no radiating pain. Similar pain that she had in R. Has gotten better since she made the appointment, being protective of the joint.    Past Medical History:  Diagnosis Date   Diverticulitis 01/10/2018   Hypertension    PTSD (post-traumatic stress disorder)    Past Surgical History:  Procedure Laterality Date   PLACEMENT OF BREAST IMPLANTS     known ruptures   RHINOPLASTY     Social History   Socioeconomic History   Marital status: Married    Spouse name: Not on file   Number of children: Not on file   Years of education: Not on file   Highest education level: Not on file  Occupational History   Not on file  Tobacco Use   Smoking status: Never   Smokeless tobacco: Never  Vaping Use   Vaping Use: Never used  Substance and Sexual Activity   Alcohol use: Yes    Comment: 1-2 glass wine per wk   Drug use: Never   Sexual activity: Not Currently    Partners: Male  Other Topics Concern   Not on file  Social History Narrative   Lives with husband      One story home       Right handed      Highest level of edu- bachelors      Works part time   International aid/development worker of Corporate investment banker Strain: Not on file  Food Insecurity: Not on file  Transportation Needs: Not on file  Physical Activity: Not on file  Stress: Not on file  Social Connections: Not on file   Allergies  Allergen Reactions   Amlodipine     Dizzy, palpitations   Other Other (See Comments)   Penicillins Other (See Comments)    Has patient had a PCN reaction causing immediate rash, facial/tongue/throat swelling, SOB or lightheadedness with hypotension: Unknown Has patient had a PCN reaction causing severe rash involving mucus membranes or skin necrosis: No Has patient had a PCN reaction that required hospitalization: No Has patient had a PCN reaction occurring within the last 10 years: No If all of the above answers are "NO", then may proceed with Cephalosporin use.    Albuterol Palpitations   Sulfa Antibiotics Rash   Family History  Problem Relation Age of Onset   Parkinson's disease Mother    Hypertension Father     Current Outpatient Medications (Endocrine & Metabolic):    alendronate (FOSAMAX) 70 MG tablet, Take 1 tablet (70 mg total) by mouth every 7 (seven) days.  Take with a full glass of water on an empty stomach.  Current Outpatient Medications (Cardiovascular):    benazepril (LOTENSIN) 40 MG tablet, TAKE 1 TABLET BY MOUTH EVERY DAY. APPOINTMENT DUE IN [place]    Current Outpatient Medications (Hematological):    vitamin B-12 (CYANOCOBALAMIN) 100 MCG tablet, Take 100 mcg by mouth daily.  Current Outpatient Medications (Other):    cholecalciferol (VITAMIN D3) 25 MCG (1000 UT) tablet, Take 1,000 Units by mouth daily.   co-enzyme Q-10 30 MG capsule, Take 30 mg by mouth daily.   Multiple Vitamins-Minerals (MULTIVITAMIN ADULT) TABS, Take 1 tablet by mouth daily.   mupirocin ointment (BACTROBAN) 2 %, Apply 1 application. topically 2 (two) times daily.    Oregano 1500 MG CAPS, Take 1 capsule by mouth daily.   vitamin C (ASCORBIC ACID) 500 MG tablet, Take 500 mg by mouth daily.   Reviewed prior external information including notes and imaging from  primary care provider As well as notes that were available from care everywhere and other healthcare systems.  Past medical history, social, surgical and family history all reviewed in electronic medical record.  No pertanent information unless stated regarding to the chief complaint.   Review of Systems:  No headache, visual changes, nausea, vomiting, diarrhea, constipation, dizziness, abdominal pain, skin rash, fevers, chills, night sweats, weight loss, swollen lymph nodes, body aches, joint swelling, chest pain, shortness of breath, mood changes. POSITIVE muscle aches  Objective  Blood pressure (!) 142/90, pulse 71, height 5\' 4"  (1.626 m), weight 130 lb (59 kg), SpO2 96 %.   General: No apparent distress alert and oriented x3 mood and affect normal, dressed appropriately.  HEENT: Pupils equal, extraocular movements intact  Respiratory: Patient's speak in full sentences and does not appear short of breath  Cardiovascular: No lower extremity edema, non tender, no erythema  Left shoulder exam does have positive impingement noted.  Some weakness noted of the rotator cuff with empty can.  Positive O'Brien's with significant discomfort noted.  Limited muscular skeletal ultrasound was performed and interpreted by Antoine Primas, M   Limited ultrasound shows significant hypoechoic changes of the bicep tendon noted.  Patient does have some increasing in Doppler flow and hypoechoic changes within the tendon sheath but not the tendon itself.  Abnormality noted of the anterior and posterior labrum.  Supraspinatus does have what appears to be some intersubstance tearing noted with hypoechoic changes.  No significant retraction noted.  Subscapularis also on the anterior aspect likely does have some tearing  noted. Impression: Likely rotator cuff tearing with questionable labral pathology    Impression and Recommendations:     The above documentation has been reviewed and is accurate and complete Judi Saa, DO

## 2023-02-09 ENCOUNTER — Encounter: Payer: Self-pay | Admitting: Family Medicine

## 2023-02-09 ENCOUNTER — Ambulatory Visit: Payer: Medicare Other | Admitting: Family Medicine

## 2023-02-09 ENCOUNTER — Ambulatory Visit (INDEPENDENT_AMBULATORY_CARE_PROVIDER_SITE_OTHER): Payer: Medicare Other

## 2023-02-09 ENCOUNTER — Other Ambulatory Visit: Payer: Self-pay

## 2023-02-09 VITALS — BP 142/90 | HR 71 | Ht 64.0 in | Wt 130.0 lb

## 2023-02-09 DIAGNOSIS — M25512 Pain in left shoulder: Secondary | ICD-10-CM

## 2023-02-09 DIAGNOSIS — M75102 Unspecified rotator cuff tear or rupture of left shoulder, not specified as traumatic: Secondary | ICD-10-CM | POA: Insufficient documentation

## 2023-02-09 DIAGNOSIS — M75112 Incomplete rotator cuff tear or rupture of left shoulder, not specified as traumatic: Secondary | ICD-10-CM

## 2023-02-09 NOTE — Assessment & Plan Note (Signed)
Patient on ultrasound does have partial tearing noted.  Weakness noted with O'Brien's as well that is concerning for potential labral pathology.  Secondary to the severity of pain and weakness I do feel advanced imaging is warranted.  Patient is already been in formal physical therapy from an outside facility and discussed with patient at great length.  At this point with it affecting daily activities, not having affecting or improving with anti-inflammatories patient will follow-up with me again after imaging to discuss further

## 2023-02-09 NOTE — Patient Instructions (Addendum)
Xrays today Compression sleeve for arm MRA [phone] Hands in peripheral vision We will be in touch

## 2023-03-07 ENCOUNTER — Ambulatory Visit
Admission: RE | Admit: 2023-03-07 | Discharge: 2023-03-07 | Disposition: A | Discharge status: Home / Self Care | Payer: Medicare Other | Source: Ambulatory Visit | Attending: Family Medicine | Admitting: Family Medicine

## 2023-03-07 DIAGNOSIS — M25512 Pain in left shoulder: Secondary | ICD-10-CM

## 2023-03-07 MED ORDER — IOPAMIDOL (ISOVUE-M 200) INJECTION 41%
15.0000 mL | Freq: Once | INTRAMUSCULAR | Status: AC
  Administered 2023-03-07: 15 mL via INTRA_ARTICULAR

## 2023-03-21 NOTE — Progress Notes (Unsigned)
[name] [address] [place] [address] Phone: [phone] Subjective:   [name], am serving as a scribe for Dr. Antoine Primas.  I'm seeing this patient by the request  of:  Corwin Levins, MD  CC: Left shoulder pain follow-up  BJY:[id number]  02/09/2023 Patient on ultrasound does have partial tearing noted.  Weakness noted with O'Brien's as well that is concerning for potential labral pathology.  Secondary to the severity of pain and weakness I do feel advanced imaging is warranted.  Patient is already been in formal physical therapy from an outside facility and discussed with patient at great length.  At this point with it affecting daily activities, not having affecting or improving with anti-inflammatories patient will follow-up with me again after imaging to discuss further      Update 03/22/2023 Cassandra Garner is a 67 y.o. female coming in with complaint of L shoulder pain. Patient states that her arm pain is not any different than last visit.    MRI 03/07/2023 Narrative & Impression  CLINICAL DATA:  Left shoulder pain and weakness for 2-3 months. No known injury or prior relevant surgery.   EXAM: MR ARTHROGRAM OF THE LEFT SHOULDER   TECHNIQUE: Multiplanar, multisequence MR imaging of the left shoulder was performed following the administration of intra-articular contrast.   CONTRAST:  See Injection Documentation.   COMPARISON:  Injection images same date.  Radiographs 02/09/2023.   FINDINGS: Rotator cuff: Moderate supraspinatus tendinosis with high-grade partial intrasubstance and articular surface insertional tearing anteriorly. There is no extension of contrast into the subacromial-subdeltoid bursa, definite full-thickness component or tendon retraction, although this tear involves more than 75% of the tendon thickness. Mild infraspinatus tendinosis without tear. The teres minor and subscapularis tendons are intact.  Nonspecific signal within the distal subscapularis tendon may relate to the injection.   Muscles:  No focal rotator cuff muscular atrophy or edema.   Biceps long head:  Intact and normally positioned.   Acromioclavicular Joint: The acromion is type 1. There are mild acromioclavicular degenerative changes. Small amount of fluid within the subacromial-subdeltoid bursa. No extension of contrast into the bursa.   Glenohumeral Joint: The shoulder joint is well distended with contrast. No evidence of intra-articular loose body.   Labrum: No evidence of labral tear.   Bones: No acute or significant extra-articular osseous findings.Prominent subcortical cyst formation in the humeral head near the supraspinatus and infraspinatus insertions.   Other: No significant soft tissue findings.   IMPRESSION: 1. High-grade, nearly full-thickness intrasubstance and articular surface insertional tear of the supraspinatus tendon anteriorly. No tendon retraction or muscular atrophy. 2. Mild infraspinatus tendinosis without tear. 3. The labrum, biceps tendon and additional components of the rotator cuff appear intact. 4. No acute osseous findings.   Past Medical History:  Diagnosis Date   Diverticulitis 01/10/2018   Hypertension    PTSD (post-traumatic stress disorder)    Past Surgical History:  Procedure Laterality Date   PLACEMENT OF BREAST IMPLANTS     known ruptures   RHINOPLASTY     Social History   Socioeconomic History   Marital status: Married    Spouse name: Not on file   Number of children: Not on file   Years of education: Not on file   Highest education level: Not on file  Occupational History   Not on file  Tobacco Use   Smoking status: Never   Smokeless tobacco: Never  Vaping Use   Vaping status: Never  Used  Substance and Sexual Activity   Alcohol use: Yes    Comment: 1-2 glass wine per wk   Drug use: Never   Sexual activity: Not Currently    Partners: Male   Other Topics Concern   Not on file  Social History Narrative   Lives with husband      One story home      Right handed      Highest level of edu- bachelors      Works part time   Social Determinants of Health   Financial Resource Strain: Patient Declined (04/13/2022)   Received from [facility]   Overall Financial Resource Strain (CARDIA)    Difficulty of Paying Living Expenses: Patient declined  Food Insecurity: Patient Declined (04/13/2022)   Received from [facility]   Hunger Vital Sign    Worried About Running Out of Food in the Last Year: Patient declined    Ran Out of Food in the Last Year: Patient declined  Transportation Needs: No Transportation Needs (04/13/2022)   Received from [facility] - Transportation    Lack of Transportation (Medical): No    Lack of Transportation (Non-Medical): No  Physical Activity: Sufficiently Active (04/13/2022)   Received from [facility]   Exercise Vital Sign    Days of Exercise per Week: 7 days    Minutes of Exercise per Session: 30 min  Stress: No Stress Concern Present (03/07/2021)   Received from [facility] Occupational [facility] - Occupational Stress Questionnaire    Feeling of Stress : Not at all  Social Connections: Moderately Integrated (03/07/2021)   Received from [facility]   Social Connection and Isolation Panel [NHANES]    Frequency of Communication with Friends and Family: Twice a week    Frequency of Social Gatherings with Friends and Family: Once a week    Attends Religious Services: Never    Database administrator or Organizations: Yes    Attends Engineer, structural: More than 4 times per year    Marital Status: Married   Allergies  Allergen Reactions   Amlodipine     Dizzy, palpitations   Other Other (See Comments)   Penicillins Other (See Comments)    Has patient had a PCN reaction causing immediate rash, facial/tongue/throat swelling, SOB or lightheadedness with  hypotension: Unknown Has patient had a PCN reaction causing severe rash involving mucus membranes or skin necrosis: No Has patient had a PCN reaction that required hospitalization: No Has patient had a PCN reaction occurring within the last 10 years: No If all of the above answers are "NO", then may proceed with Cephalosporin use.    Albuterol Palpitations   Sulfa Antibiotics Rash   Family History  Problem Relation Age of Onset   Parkinson's disease Mother    Hypertension Father     Current Outpatient Medications (Endocrine & Metabolic):    alendronate (FOSAMAX) 70 MG tablet, Take 1 tablet (70 mg total) by mouth every 7 (seven) days. Take with a full glass of water on an empty stomach.  Current Outpatient Medications (Cardiovascular):    benazepril (LOTENSIN) 40 MG tablet, TAKE 1 TABLET BY MOUTH EVERY DAY. APPOINTMENT DUE IN [place]    Current Outpatient Medications (Hematological):    vitamin B-12 (CYANOCOBALAMIN) 100 MCG tablet, Take 100 mcg by mouth daily.  Current Outpatient Medications (Other):    cholecalciferol (VITAMIN D3) 25 MCG (1000 UT) tablet, Take 1,000 Units by mouth daily.  co-enzyme Q-10 30 MG capsule, Take 30 mg by mouth daily.   Multiple Vitamins-Minerals (MULTIVITAMIN ADULT) TABS, Take 1 tablet by mouth daily.   mupirocin ointment (BACTROBAN) 2 %, Apply 1 application. topically 2 (two) times daily.   Oregano 1500 MG CAPS, Take 1 capsule by mouth daily.   vitamin C (ASCORBIC ACID) 500 MG tablet, Take 500 mg by mouth daily.  \  Objective  Blood pressure 110/68, pulse 78, height 5\' 4"  (1.626 m), weight 133 lb (60.3 kg), SpO2 96%.   General: No apparent distress alert and oriented x3 mood and affect normal, dressed appropriately.  HEENT: Pupils equal, extraocular movements intact  Respiratory: Patient's speak in full sentences and does not appear short of breath  Left shoulder does have weakness noted.  Positive empty can sign.   Impression and  Recommendations:     The above documentation has been reviewed and is accurate and complete Judi Saa, DO

## 2023-03-22 ENCOUNTER — Other Ambulatory Visit: Payer: Self-pay

## 2023-03-22 ENCOUNTER — Ambulatory Visit (INDEPENDENT_AMBULATORY_CARE_PROVIDER_SITE_OTHER): Payer: Medicare Other | Admitting: Family Medicine

## 2023-03-22 ENCOUNTER — Encounter: Payer: Self-pay | Admitting: Family Medicine

## 2023-03-22 VITALS — BP 110/68 | HR 78 | Ht 64.0 in | Wt 133.0 lb

## 2023-03-22 DIAGNOSIS — M25512 Pain in left shoulder: Secondary | ICD-10-CM | POA: Diagnosis not present

## 2023-03-22 DIAGNOSIS — M75112 Incomplete rotator cuff tear or rupture of left shoulder, not specified as traumatic: Secondary | ICD-10-CM

## 2023-03-22 NOTE — Assessment & Plan Note (Signed)
Patient does have confirmation of the tear noted on the MRI.  It is a high-grade tear noted having weakness and how it is affecting daily activities I do think that surgical intervention would be beneficial.  We discussed the only other alternative would be PRP injection.  Patient has responded to surgery on the right shoulder previously.  Will refer patient to orthopedic surgery to discuss further.

## 2023-03-22 NOTE — Patient Instructions (Signed)
Dr. Ave Filter will call you

## 2023-04-06 ENCOUNTER — Ambulatory Visit (INDEPENDENT_AMBULATORY_CARE_PROVIDER_SITE_OTHER): Payer: Medicare Other | Admitting: Family Medicine

## 2023-04-06 VITALS — BP 136/96 | HR 83 | Ht 64.0 in | Wt 132.0 lb

## 2023-04-06 DIAGNOSIS — M75112 Incomplete rotator cuff tear or rupture of left shoulder, not specified as traumatic: Secondary | ICD-10-CM

## 2023-04-06 NOTE — Assessment & Plan Note (Addendum)
Discussed with patient again in a long time.  Patient's MRI did show that there is a small high-grade tear of the noted.  Patient did have weakness.  Has elected though to try the PRP.  Will schedule that in the next month.  Total time after addressing all patient's questions 36 minutes this includes reviewing patient's MRI.

## 2023-04-06 NOTE — Progress Notes (Signed)
[facility] [address] [place] [address] Phone: [phone] Subjective:   INadine Counts, am serving as a scribe for Dr. Antoine Primas.  I'm seeing this patient by the request  of:  Corwin Levins, MD  CC: Shoulder pain follow-up  [id number]  03/22/2023 Patient does have confirmation of the tear noted on the MRI.  It is a high-grade tear noted having weakness and how it is affecting daily activities I do think that surgical intervention would be beneficial.  We discussed the only other alternative would be PRP injection.  Patient has responded to surgery on the right shoulder previously.  Will refer patient to orthopedic surgery to discuss further.     Update 04/07/2023 Cassandra Garner is a 67 y.o. female coming in with complaint of L shoulder pain. Patient states talk about PRP for shoulder. Shoulder has been better, but she hasn't done much.      Past Medical History:  Diagnosis Date   Diverticulitis 01/10/2018   Hypertension    PTSD (post-traumatic stress disorder)    Past Surgical History:  Procedure Laterality Date   PLACEMENT OF BREAST IMPLANTS     known ruptures   RHINOPLASTY     Social History   Socioeconomic History   Marital status: Married    Spouse name: Not on file   Number of children: Not on file   Years of education: Not on file   Highest education level: Not on file  Occupational History   Not on file  Tobacco Use   Smoking status: Never   Smokeless tobacco: Never  Vaping Use   Vaping status: Never Used  Substance and Sexual Activity   Alcohol use: Yes    Comment: 1-2 glass wine per wk   Drug use: Never   Sexual activity: Not Currently    Partners: Male  Other Topics Concern   Not on file  Social History Narrative   Lives with husband      One story home      Right handed      Highest level of edu- bachelors      Works part time   Social Determinants of Health   Financial Resource  Strain: Patient Declined (04/13/2022)   Received from [facility]   Overall Financial Resource Strain (CARDIA)    Difficulty of Paying Living Expenses: Patient declined  Food Insecurity: Patient Declined (04/13/2022)   Received from [facility]   Hunger Vital Sign    Worried About Running Out of Food in the Last Year: Patient declined    Ran Out of Food in the Last Year: Patient declined  Transportation Needs: No Transportation Needs (04/13/2022)   Received from [facility] - Transportation    Lack of Transportation (Medical): No    Lack of Transportation (Non-Medical): No  Physical Activity: Sufficiently Active (04/13/2022)   Received from [facility]   Exercise Vital Sign    Days of Exercise per Week: 7 days    Minutes of Exercise per Session: 30 min  Stress: No Stress Concern Present (03/07/2021)   Received from [facility] of Occupational Health - Occupational Stress Questionnaire    Feeling of Stress : Not at all  Social Connections: Moderately Integrated (03/07/2021)   Received from [facility]   Social Connection and Isolation Panel [NHANES]    Frequency of Communication with Friends and Family: Twice a week    Frequency of Social Gatherings  with Friends and Family: Once a week    Attends Religious Services: Never    Database administrator or Organizations: Yes    Attends Engineer, structural: More than 4 times per year    Marital Status: Married   Allergies  Allergen Reactions   Amlodipine     Dizzy, palpitations   Other Other (See Comments)   Penicillins Other (See Comments)    Has patient had a PCN reaction causing immediate rash, facial/tongue/throat swelling, SOB or lightheadedness with hypotension: Unknown Has patient had a PCN reaction causing severe rash involving mucus membranes or skin necrosis: No Has patient had a PCN reaction that required hospitalization: No Has patient had a PCN reaction occurring within the last 10  years: No If all of the above answers are "NO", then may proceed with Cephalosporin use.    Albuterol Palpitations   Sulfa Antibiotics Rash   Family History  Problem Relation Age of Onset   Parkinson's disease Mother    Hypertension Father     Current Outpatient Medications (Endocrine & Metabolic):    alendronate (FOSAMAX) 70 MG tablet, Take 1 tablet (70 mg total) by mouth every 7 (seven) days. Take with a full glass of water on an empty stomach.  Current Outpatient Medications (Cardiovascular):    benazepril (LOTENSIN) 40 MG tablet, TAKE 1 TABLET BY MOUTH EVERY DAY. APPOINTMENT DUE IN [place]    Current Outpatient Medications (Hematological):    vitamin B-12 (CYANOCOBALAMIN) 100 MCG tablet, Take 100 mcg by mouth daily.  Current Outpatient Medications (Other):    cholecalciferol (VITAMIN D3) 25 MCG (1000 UT) tablet, Take 1,000 Units by mouth daily.   co-enzyme Q-10 30 MG capsule, Take 30 mg by mouth daily.   Multiple Vitamins-Minerals (MULTIVITAMIN ADULT) TABS, Take 1 tablet by mouth daily.   mupirocin ointment (BACTROBAN) 2 %, Apply 1 application. topically 2 (two) times daily.   Oregano 1500 MG CAPS, Take 1 capsule by mouth daily.   vitamin C (ASCORBIC ACID) 500 MG tablet, Take 500 mg by mouth daily.    Objective  Blood pressure (!) 136/96, pulse 83, height 5\' 4"  (1.626 m), weight 132 lb (59.9 kg), SpO2 98%.   General: No apparent distress alert and oriented x3 mood and affect normal, dressed appropriately.  Deferred the physical exam    Impression and Recommendations:     The above documentation has been reviewed and is accurate and complete Judi Saa, DO

## 2023-04-06 NOTE — Patient Instructions (Signed)
PRP in 4 weeks Magnesium 400mg 

## 2023-04-12 ENCOUNTER — Ambulatory Visit: Payer: BLUE CROSS/BLUE SHIELD | Admitting: Family Medicine

## 2023-04-29 DIAGNOSIS — L989 Disorder of the skin and subcutaneous tissue, unspecified: Secondary | ICD-10-CM | POA: Insufficient documentation

## 2023-04-29 DIAGNOSIS — R140 Abdominal distension (gaseous): Secondary | ICD-10-CM | POA: Insufficient documentation

## 2023-05-04 DIAGNOSIS — D136 Benign neoplasm of pancreas: Secondary | ICD-10-CM | POA: Insufficient documentation

## 2023-05-04 NOTE — Progress Notes (Unsigned)
[name] [address] [place] [address] Phone: [phone] Subjective:   [name], am serving as a scribe for Dr. Antoine Primas.  I'm seeing this patient by the request  of:  Corwin Levins, MD  CC: Left shoulder pain follow-up  [id number]  04/06/2023 Discussed with patient again in a long time.  Patient's MRI did show that there is a small high-grade tear of the noted.  Patient did have weakness.  Has elected though to try the PRP.  Will schedule that in the next month.  Total time after addressing all patient's questions 36 minutes this includes reviewing patient's MRI.    Update 05/05/2023 Cassandra Garner is a 67 y.o. female coming in with complaint of L shoulder pain. Patient states      Past Medical History:  Diagnosis Date   Diverticulitis 01/10/2018   Hypertension    PTSD (post-traumatic stress disorder)    Past Surgical History:  Procedure Laterality Date   PLACEMENT OF BREAST IMPLANTS     known ruptures   RHINOPLASTY     Social History   Socioeconomic History   Marital status: Married    Spouse name: Not on file   Number of children: Not on file   Years of education: Not on file   Highest education level: Not on file  Occupational History   Not on file  Tobacco Use   Smoking status: Never   Smokeless tobacco: Never  Vaping Use   Vaping status: Never Used  Substance and Sexual Activity   Alcohol use: Yes    Comment: 1-2 glass wine per wk   Drug use: Never   Sexual activity: Not Currently    Partners: Male  Other Topics Concern   Not on file  Social History Narrative   Lives with husband      One story home      Right handed      Highest level of edu- bachelors      Works part time   Social Determinants of Health   Financial Resource Strain: Patient Declined (04/13/2022)   Received [facility], [facility]   Overall Financial Resource Strain (CARDIA)    Difficulty of Paying Living  Expenses: Patient declined  Food Insecurity: Patient Declined (04/13/2022)   Received from [facility], [facility]   Hunger Vital Sign    Worried About Running Out of Food in the Last Year: Patient declined    Ran Out of Food in the Last Year: Patient declined  Transportation Needs: No Transportation Needs (04/13/2022)   Received from [facility], [facility]   [facility] - Transportation    Lack of Transportation (Medical): No    Lack of Transportation (Non-Medical): No  Physical Activity: Sufficiently Active (04/13/2022)   Received from [facility], [facility]   Exercise Vital Sign    Days of Exercise per Week: 7 days    Minutes of Exercise per Session: 30 min  Stress: No Stress Concern Present (03/07/2021)   Received from [facility] of Occupational Health - Occupational Stress Questionnaire    Feeling of Stress : Not at all  Social Connections: Moderately Integrated (03/07/2021)   Received from [facility], [facility]   Social Connection and Isolation Panel [NHANES]    Frequency of Communication with Friends and Family: Twice a week    Frequency of Social Gatherings with Friends and Family: Once a week    Attends  Religious Services: Never    Active Member of Clubs or Organizations: Yes    Attends Engineer, structural: More than 4 times per year    Marital Status: Married   Allergies  Allergen Reactions   Amlodipine     Dizzy, palpitations   Other Other (See Comments)   Penicillins Other (See Comments)    Has patient had a PCN reaction causing immediate rash, facial/tongue/throat swelling, SOB or lightheadedness with hypotension: Unknown Has patient had a PCN reaction causing severe rash involving mucus membranes or skin necrosis: No Has patient had a PCN reaction that required hospitalization: No Has patient had a PCN reaction occurring within the last 10 years: No If all of the above answers are "NO", then may proceed with  Cephalosporin use.    Albuterol Palpitations   Sulfa Antibiotics Rash   Family History  Problem Relation Age of Onset   Parkinson's disease Mother    Hypertension Father     Current Outpatient Medications (Endocrine & Metabolic):    alendronate (FOSAMAX) 70 MG tablet, Take 1 tablet (70 mg total) by mouth every 7 (seven) days. Take with a full glass of water on an empty stomach.  Current Outpatient Medications (Cardiovascular):    benazepril (LOTENSIN) 40 MG tablet, TAKE 1 TABLET BY MOUTH EVERY DAY. APPOINTMENT DUE IN [place]    Current Outpatient Medications (Hematological):    vitamin B-12 (CYANOCOBALAMIN) 100 MCG tablet, Take 100 mcg by mouth daily.  Current Outpatient Medications (Other):    cholecalciferol (VITAMIN D3) 25 MCG (1000 UT) tablet, Take 1,000 Units by mouth daily.   co-enzyme Q-10 30 MG capsule, Take 30 mg by mouth daily.   Multiple Vitamins-Minerals (MULTIVITAMIN ADULT) TABS, Take 1 tablet by mouth daily.   mupirocin ointment (BACTROBAN) 2 %, Apply 1 application. topically 2 (two) times daily.   Oregano 1500 MG CAPS, Take 1 capsule by mouth daily.   vitamin C (ASCORBIC ACID) 500 MG tablet, Take 500 mg by mouth daily.   Reviewed prior external information including notes and imaging from  primary care provider As well as notes that were available from care everywhere and other healthcare systems.  Past medical history, social, surgical and family history all reviewed in electronic medical record.  No pertanent information unless stated regarding to the chief complaint.   Review of Systems:  No headache, visual changes, nausea, vomiting, diarrhea, constipation, dizziness, abdominal pain, skin rash, fevers, chills, night sweats, weight loss, swollen lymph nodes, body aches, joint swelling, chest pain, shortness of breath, mood changes. POSITIVE muscle aches  Objective  Blood pressure 122/82, pulse 67, height 5\' 4"  (1.626 m), weight 134 lb (60.8 kg), SpO2 96%.    General: No apparent distress alert and oriented x3 mood and affect normal, dressed appropriately.   Procedure: Real-time Ultrasound Guided Injection of left glenohumeral joint Device: GE Logiq E  Ultrasound guided injection is preferred based studies that show increased duration, increased effect, greater accuracy, decreased procedural pain, increased response rate with ultrasound guided versus blind injection.  Verbal informed consent obtained.  Time-out conducted.  Noted no overlying erythema, induration, or other signs of local infection.  Skin prepped in a sterile fashion.  Local anesthesia: Topical Ethyl chloride.  With sterile technique and under real time ultrasound guidance:  Joint visualized.  21g 2 inch needle inserted lateral approach. Pictures taken for needle placement. Patient did have injection of 2 cc of 0.5% Marcaine, then injected 4 cc of PRP into the supraspinatus tendon sheath. Completed without  difficulty  Pain immediately resolved suggesting accurate placement of the medication.  Advised to call if fevers/chills, erythema, induration, drainage, or persistent bleeding.  Images permanently stored and available for review in the ultrasound unit.  Impression: Technically successful ultrasound guided injection.    Impression and Recommendations:      The above documentation has been reviewed and is accurate and complete Judi Saa, DO

## 2023-05-05 ENCOUNTER — Other Ambulatory Visit: Payer: Self-pay

## 2023-05-05 ENCOUNTER — Encounter: Payer: Self-pay | Admitting: Family Medicine

## 2023-05-05 ENCOUNTER — Ambulatory Visit: Payer: Self-pay | Admitting: Family Medicine

## 2023-05-05 VITALS — BP 122/82 | HR 67 | Ht 64.0 in | Wt 134.0 lb

## 2023-05-05 DIAGNOSIS — M25511 Pain in right shoulder: Secondary | ICD-10-CM

## 2023-05-05 DIAGNOSIS — M75112 Incomplete rotator cuff tear or rupture of left shoulder, not specified as traumatic: Secondary | ICD-10-CM

## 2023-05-05 NOTE — Assessment & Plan Note (Signed)
Post PRP injection given given and discussed avoiding icing regimen at this time.  Patient after 72 hours can start ice and increase activity slowly.  Discussed if any redness over the weekend to seek medical attention.  Follow-up again in 6 to 8 weeks otherwise to make sure patient is responding appropriately.

## 2023-05-05 NOTE — Patient Instructions (Signed)
No ice or IBU for 3 days Heat and tylenol are ok See me again in 6 weeks

## 2023-05-07 ENCOUNTER — Other Ambulatory Visit: Payer: Self-pay | Admitting: Internal Medicine

## 2023-05-09 ENCOUNTER — Telehealth: Payer: Self-pay | Admitting: Internal Medicine

## 2023-05-09 NOTE — Telephone Encounter (Signed)
Prescription Request  05/09/2023  LOV: 09/24/2022  What is the name of the medication or equipment? benazepril (LOTENSIN) 40 MG tablet   Have you contacted your pharmacy to request a refill? No   Which pharmacy would you like this sent to?    [id number] - [place], [place] - [address] AT [facility] [address] [place] [address] Phone: [phone] Fax: [phone]    Patient notified that their request is being sent to the clinical staff for review and that they should receive a response within 2 business days.   Please advise at Mobile [phone] (mobile)

## 2023-05-09 NOTE — Telephone Encounter (Signed)
Not due for refill until December

## 2023-05-10 ENCOUNTER — Other Ambulatory Visit: Payer: Self-pay | Admitting: Internal Medicine

## 2023-05-10 NOTE — Telephone Encounter (Signed)
Pt is completely out of her medication.

## 2023-06-15 NOTE — Progress Notes (Unsigned)
[facility] [address] [place] [address] Phone: [phone] Subjective:   [name], am serving as a scribe for Dr. Antoine Primas.  I'm seeing this patient by the request  of:  Corwin Levins, MD  CC: Left shoulder pain follow-up  [id number]  05/05/2023 Post PRP injection given given and discussed avoiding icing regimen at this time.  Patient after 72 hours can start ice and increase activity slowly.  Discussed if any redness over the weekend to seek medical attention.  Follow-up again in 6 to 8 weeks otherwise to make sure patient is responding appropriately.      Update 06/16/2023 Cassandra Garner is a 67 y.o. female coming in with complaint of L shoulder pain.  Patient was given PRP on September 12 was to do post PRP protocol.  Patient states does feel improvement. Last week had a flare. Scaption and pulling up her pants increases her pain.   -   MRI of the left shoulder did show high grade near full-thickness intersubstance tear of the supraspinatus with no significant retraction or muscle atrophy.  This was on July 2024.  Past Medical History:  Diagnosis Date   Diverticulitis 01/10/2018   Hypertension    PTSD (post-traumatic stress disorder)    Past Surgical History:  Procedure Laterality Date   PLACEMENT OF BREAST IMPLANTS     known ruptures   RHINOPLASTY     Social History   Socioeconomic History   Marital status: Married    Spouse name: Not on file   Number of children: Not on file   Years of education: Not on file   Highest education level: Not on file  Occupational History   Not on file  Tobacco Use   Smoking status: Never   Smokeless tobacco: Never  Vaping Use   Vaping status: Never Used  Substance and Sexual Activity   Alcohol use: Yes    Comment: 1-2 glass wine per wk   Drug use: Never   Sexual activity: Not Currently    Partners: Male  Other Topics Concern   Not on file  Social History Narrative    Lives with husband      One story home      Right handed      Highest level of edu- bachelors      Works part time   Social Determinants of Health   Financial Resource Strain: Patient Declined (04/13/2022)   [facility] from [facility], [facility]   Overall Financial Resource Strain (CARDIA)    Difficulty of Paying Living Expenses: Patient declined  Food Insecurity: Patient Declined (04/13/2022)   Received from [facility], [facility] [place] [facility]   Hunger Vital Sign    Worried About Running Out of Food in the Last Year: Patient declined    Ran Out of Food in the Last Year: Patient declined  Transportation Needs: No Transportation Needs (04/13/2022)   [facility]   [facility] - Transportation    Lack of Transportation (Medical): No    Lack of Transportation (Non-Medical): No  Physical Activity: Sufficiently Active (04/13/2022)   Received from [facility], [facility]   Exercise Vital Sign    Days of Exercise per Week: 7 days    Minutes of Exercise per Session: 30 min  Stress: No Stress Concern Present (03/07/2021)   Received from [facility] [place] [facility], [facility] of Occupational Health - Occupational Stress Questionnaire    Feeling of  Stress : Not at all  Social Connections: Moderately Integrated (03/07/2021)   Received from [facility], [facility]   Social Connection and Isolation Panel [NHANES]    Frequency of Communication with Friends and Family: Twice a week    Frequency of Social Gatherings with Friends and Family: Once a week    Attends Religious Services: Never    Database administrator or Organizations: Yes    Attends Engineer, structural: More than 4 times per year    Marital Status: Married   Allergies  Allergen Reactions   Amlodipine     Dizzy, palpitations   Other Other (See Comments)   Penicillins Other (See Comments)    Has patient had a PCN reaction causing immediate rash, facial/tongue/throat swelling, SOB or  lightheadedness with hypotension: Unknown Has patient had a PCN reaction causing severe rash involving mucus membranes or skin necrosis: No Has patient had a PCN reaction that required hospitalization: No Has patient had a PCN reaction occurring within the last 10 years: No If all of the above answers are "NO", then may proceed with Cephalosporin use.    Albuterol Palpitations   Sulfa Antibiotics Rash   Family History  Problem Relation Age of Onset   Parkinson's disease Mother    Hypertension Father     Current Outpatient Medications (Endocrine & Metabolic):    alendronate (FOSAMAX) 70 MG tablet, Take 1 tablet (70 mg total) by mouth every 7 (seven) days. Take with a full glass of water on an empty stomach.  Current Outpatient Medications (Cardiovascular):    benazepril (LOTENSIN) 40 MG tablet, TAKE 1 TABLET BY MOUTH EVERY DAY. APPOINTMENT DUE IN [place]    Current Outpatient Medications (Hematological):    vitamin B-12 (CYANOCOBALAMIN) 100 MCG tablet, Take 100 mcg by mouth daily.  Current Outpatient Medications (Other):    cholecalciferol (VITAMIN D3) 25 MCG (1000 UT) tablet, Take 1,000 Units by mouth daily.   co-enzyme Q-10 30 MG capsule, Take 30 mg by mouth daily.   Multiple Vitamins-Minerals (MULTIVITAMIN ADULT) TABS, Take 1 tablet by mouth daily.   mupirocin ointment (BACTROBAN) 2 %, Apply 1 application. topically 2 (two) times daily.   Oregano 1500 MG CAPS, Take 1 capsule by mouth daily.   vitamin C (ASCORBIC ACID) 500 MG tablet, Take 500 mg by mouth daily.   Reviewed prior external information including notes and imaging from  primary care provider As well as notes that were available from care everywhere and other healthcare systems.  Past medical history, social, surgical and family history all reviewed in electronic medical record.  No pertanent information unless stated regarding to the chief complaint.   Review of Systems:  No headache, visual changes, nausea,  vomiting, diarrhea, constipation, dizziness, abdominal pain, skin rash, fevers, chills, night sweats, weight loss, swollen lymph nodes, body aches, joint swelling, chest pain, shortness of breath, mood changes. POSITIVE muscle aches  Objective  Blood pressure 112/62, pulse 96, height 5\' 4"  (1.626 m), weight 125 lb (56.7 kg), SpO2 98%.   General: No apparent distress alert and oriented x3 mood and affect normal, dressed appropriately.  HEENT: Pupils equal, extraocular movements intact  Respiratory: Patient's speak in full sentences and does not appear short of breath  Cardiovascular: No lower extremity edema, non tender, no erythema  Left shoulder exam shows patient is having some tenderness to palpation still over the left shoulder.  Patient does have positive impingement noted.  Positive crossover noted.  Rotator cuff strength though does appear to  be improved from previous exam.  Limited muscular skeletal ultrasound was performed and interpreted by Antoine Primas, M  Limited ultrasound does have left rotator cuff does appear to have some tendon healing noted with some cartilage or scar tissue noted.  Swelling of the acromioclavicular joint noted. Impression: Interval healing    Impression and Recommendations:    The above documentation has been reviewed and is accurate and complete Judi Saa, DO

## 2023-06-16 ENCOUNTER — Ambulatory Visit: Payer: Medicare Other | Admitting: Family Medicine

## 2023-06-16 ENCOUNTER — Other Ambulatory Visit: Payer: Self-pay

## 2023-06-16 VITALS — BP 112/62 | HR 96 | Ht 64.0 in | Wt 125.0 lb

## 2023-06-16 DIAGNOSIS — M25512 Pain in left shoulder: Secondary | ICD-10-CM

## 2023-06-16 DIAGNOSIS — M75112 Incomplete rotator cuff tear or rupture of left shoulder, not specified as traumatic: Secondary | ICD-10-CM | POA: Diagnosis not present

## 2023-06-16 NOTE — Patient Instructions (Signed)
Good to see you Healing just fine Ice  Voltaren See me in 6-8 weeks

## 2023-06-16 NOTE — Assessment & Plan Note (Signed)
I do see some relatively good healing noted at this time.  Still having some pain with certain past.  Patient though is voluntarily guarding fairly significantly.  Discussed with patient to consider this as well.  Patient would like to consider the possibility of another PRP.  Does have a AC swelling as well.  Discussed icing regimen and home exercises.  Increase activity slowly otherwise.  Follow-up again in 6 to 8 weeks

## 2023-07-04 ENCOUNTER — Ambulatory Visit (INDEPENDENT_AMBULATORY_CARE_PROVIDER_SITE_OTHER): Payer: Medicare Other

## 2023-07-04 ENCOUNTER — Encounter: Payer: Self-pay | Admitting: Family Medicine

## 2023-07-04 ENCOUNTER — Ambulatory Visit (INDEPENDENT_AMBULATORY_CARE_PROVIDER_SITE_OTHER): Payer: Medicare Other | Admitting: Family Medicine

## 2023-07-04 VITALS — HR 62 | Ht 64.0 in

## 2023-07-04 DIAGNOSIS — M545 Low back pain, unspecified: Secondary | ICD-10-CM | POA: Diagnosis not present

## 2023-07-04 DIAGNOSIS — S52021A Displaced fracture of olecranon process without intraarticular extension of right ulna, initial encounter for closed fracture: Secondary | ICD-10-CM | POA: Diagnosis not present

## 2023-07-04 DIAGNOSIS — M79631 Pain in right forearm: Secondary | ICD-10-CM

## 2023-07-04 MED ORDER — OXYCODONE-ACETAMINOPHEN 5-325 MG PO TABS: 1.0000 | ORAL_TABLET | Freq: Three times a day (TID) | ORAL | 0 refills | Status: DC | PRN

## 2023-07-04 NOTE — Patient Instructions (Addendum)
Thank you for coming in today.   Continue the splint.   Recheck with me as needed.   Phone: [phone]  Dr Frazier Butt at [facility].   Let me know if this is not working.

## 2023-07-04 NOTE — Progress Notes (Signed)
I, Stevenson Clinch, CMA acting as a scribe for Cassandra Graham, MD.  Cassandra Garner is a 67 y.o. female who presents to [facility] at [facility] today for R arm pain. Pt was previously seen by Dr. Katrinka Blazing on 06/16/23 for L shoulder pain.  Today, pt c/o R arm pain after suffering a fall earlier this morning. Was working in the yard and tripped over a vine. Pt locates pain to entire joint. Hx of surgical repair of right shoulder, s/p PRP for left shoulder. Notes limited ROM, unable to raise the arm. Had applied ice, no other treatment modalities.   Pertinent review of systems: No fevers or chills  Relevant historical information: History of right rotator cuff tear requiring surgical reconstruction Dr. Ave Filter.  History of left shoulder rotator cuff tear with PRP injection   Exam:  Pulse 62   Ht 5\' 4"  (1.626 m)   SpO2 96%   BMI 21.46 kg/m  General: Well Developed, well nourished, and in no acute distress.   MSK: Right elbow mild swelling.  Tender palpation along olecranon.  Very limited range of motion. Pulses cap refill sensation and strength are intact distally.    Lab and Radiology Results  X-ray images right elbow and lumbar spine obtained today personally and independently interpreted  Right elbow: Oblique fracture through the proximal ulna extending into the joint.  Fracture is minimally displaced.  Lumbar spine: No acute fractures are visible.  Mild degenerative changes.  Await formal radiology review  Patient was placed in a custom forearm fiberglass long-arm posterior arm splint.  She was unable to flex the elbow to 90 degrees.  The elbow is approximately 110 degrees.  Assessment and Plan: 67 y.o. female with right elbow fracture.  The fracture extends into the joint and is mildly displaced.  Discussed with hand surgeon I will refer to Dr. Frazier Butt at [facility].  Dr. Fara Boros may be a backup plan or Dr. Ave Filter could be a backup plan as well.  Dr.  Frazier Butt should be able to get her in ASAP.  The fracture is so dominant that her other issues today are going to take a backseat and will be addressed the near future if needed.  Oxycodone prescribed for emergency pain control if needed tonight. PDMP reviewed during this encounter. Orders Placed This Encounter  Procedures   DG ELBOW COMPLETE RIGHT (3+VIEW)    Standing Status:   Future    Number of Occurrences:   1    Standing Expiration Date:   08/03/2023    Order Specific Question:   Reason for Exam (SYMPTOM  OR DIAGNOSIS REQUIRED)    Answer:   right elbow    Order Specific Question:   Preferred imaging location?    Answer:   [facility]   DG Lumbar Spine 2-3 Views    Standing Status:   Future    Number of Occurrences:   1    Standing Expiration Date:   08/03/2023    Order Specific Question:   Reason for Exam (SYMPTOM  OR DIAGNOSIS REQUIRED)    Answer:   low back pain    Order Specific Question:   Preferred imaging location?    Answer:   [facility]   Ambulatory referral to Orthopedic Surgery    Referral Priority:   Routine    Referral Type:   Surgical    Referral Reason:   Specialty Services Required    Referred to Provider:   Marlyne Beards, MD  Requested Specialty:   Orthopedic Surgery    Number of Visits Requested:   1   Meds ordered this encounter  Medications   oxyCODONE-acetaminophen (PERCOCET/ROXICET) 5-325 MG tablet    Sig: Take 1 tablet by mouth every 8 (eight) hours as needed for severe pain (pain score 7-10).    Dispense:  10 tablet    Refill:  0     Discussed warning signs or symptoms. Please see discharge instructions. Patient expresses understanding.   The above documentation has been reviewed and is accurate and complete Cassandra Garner, M.D.

## 2023-07-05 ENCOUNTER — Telehealth: Payer: Self-pay | Admitting: Orthopedic Surgery

## 2023-07-05 NOTE — Telephone Encounter (Signed)
Left voicemail for patient. Dr. Denyse Amass reached out to Dr. Fara Boros in regards to this patient. She has an elbow fracture that needs to be seen this week. Thursday AM

## 2023-07-25 NOTE — Progress Notes (Signed)
Right elbow x-ray shows a fracture like we talked about in clinic.

## 2023-07-25 NOTE — Progress Notes (Signed)
 Low back x-ray shows arthritis.

## 2023-07-27 NOTE — Progress Notes (Unsigned)
[name] [address] [place] [address] Phone: [phone] Subjective:   [name], am serving as a scribe for Dr. Antoine Primas.  I'm seeing this patient by the request  of:  Corwin Levins, MD  CC: Left shoulder pain  [id number]  06/16/2023 I do see some relatively good healing noted at this time.  Still having some pain with certain past.  Patient though is voluntarily guarding fairly significantly.  Discussed with patient to consider this as well.  Patient would like to consider the possibility of another PRP.  Does have a AC swelling as well.  Discussed icing regimen and home exercises.  Increase activity slowly otherwise.  Follow-up again in 6 to 8 weeks      Update 07/28/2023 Almyra Koral is a 67 y.o. female coming in with complaint of L RTC. Patient suffered R elbow fx on 07/04/23. Saw Dr. Denyse Amass. Patient states that she was doing better but her pain increased when she was putting on her jacket. Pain is located in anterior aspect.   Had surgery 3 weeks ago by Dr. Frazier Butt.        Past Medical History:  Diagnosis Date   Diverticulitis 01/10/2018   Hypertension    PTSD (post-traumatic stress disorder)    Past Surgical History:  Procedure Laterality Date   PLACEMENT OF BREAST IMPLANTS     known ruptures   RHINOPLASTY     Social History   Socioeconomic History   Marital status: Married    Spouse name: Not on file   Number of children: Not on file   Years of education: Not on file   Highest education level: Not on file  Occupational History   Not on file  Tobacco Use   Smoking status: Never   Smokeless tobacco: Never  Vaping Use   Vaping status: Never Used  Substance and Sexual Activity   Alcohol use: Yes    Comment: 1-2 glass wine per wk   Drug use: Never   Sexual activity: Not Currently    Partners: Male  Other Topics Concern   Not on file  Social History Narrative   Lives with husband      One  story home      Right handed      Highest level of edu- bachelors      Works part time   Social Determinants of Health   Financial Resource Strain: Patient Declined (04/13/2022)   Received from [facility], [facility]   Overall Financial Resource Strain (CARDIA)    Difficulty of Paying Living Expenses: Patient declined  Food Insecurity: Patient Declined (04/13/2022)   Received from [facility], [facility]   Hunger Vital Sign    Worried About Running Out of Food in the Last Year: Patient declined    Ran Out of Food in the Last Year: Patient declined  Transportation Needs: No Transportation Needs (04/13/2022)   Received from [facility], [facility]   [facility] - Transportation    Lack of Transportation (Medical): No    Lack of Transportation (Non-Medical): No  Physical Activity: Sufficiently Active (04/13/2022)   Received from [facility], [facility]   Exercise Vital Sign    Days of Exercise per Week: 7 days    Minutes of Exercise per Session: 30 min  Stress: No Stress Concern Present (03/07/2021)   Received from [facility], [facility] of Occupational Health - Occupational Stress Questionnaire  Feeling of Stress : Not at all  Social Connections: Moderately Integrated (03/07/2021)   Received from [facility], [facility]   Social Connection and Isolation Panel [NHANES]    Frequency of Communication with Friends and Family: Twice a week    Frequency of Social Gatherings with Friends and Family: Once a week    Attends Religious Services: Never    Database administrator or Organizations: Yes    Attends Engineer, structural: More than 4 times per year    Marital Status: Married   Allergies  Allergen Reactions   Amlodipine     Dizzy, palpitations   Other Other (See Comments)   Penicillins Other (See Comments)    Has patient had a PCN reaction causing immediate rash, facial/tongue/throat swelling, SOB or lightheadedness with hypotension:  Unknown Has patient had a PCN reaction causing severe rash involving mucus membranes or skin necrosis: No Has patient had a PCN reaction that required hospitalization: No Has patient had a PCN reaction occurring within the last 10 years: No If all of the above answers are "NO", then may proceed with Cephalosporin use.    Albuterol Palpitations   Sulfa Antibiotics Rash   Family History  Problem Relation Age of Onset   Parkinson's disease Mother    Hypertension Father     Current Outpatient Medications (Endocrine & Metabolic):    alendronate (FOSAMAX) 70 MG tablet, Take 1 tablet (70 mg total) by mouth every 7 (seven) days. Take with a full glass of water on an empty stomach.  Current Outpatient Medications (Cardiovascular):    benazepril (LOTENSIN) 40 MG tablet, TAKE 1 TABLET BY MOUTH EVERY DAY. APPOINTMENT DUE IN [place]   Current Outpatient Medications (Analgesics):    oxyCODONE-acetaminophen (PERCOCET/ROXICET) 5-325 MG tablet, Take 1 tablet by mouth every 8 (eight) hours as needed for severe pain (pain score 7-10).  Current Outpatient Medications (Hematological):    vitamin B-12 (CYANOCOBALAMIN) 100 MCG tablet, Take 100 mcg by mouth daily.  Current Outpatient Medications (Other):    cholecalciferol (VITAMIN D3) 25 MCG (1000 UT) tablet, Take 1,000 Units by mouth daily.   co-enzyme Q-10 30 MG capsule, Take 30 mg by mouth daily.   Multiple Vitamins-Minerals (MULTIVITAMIN ADULT) TABS, Take 1 tablet by mouth daily.   mupirocin ointment (BACTROBAN) 2 %, Apply 1 application. topically 2 (two) times daily.   Oregano 1500 MG CAPS, Take 1 capsule by mouth daily.   vitamin C (ASCORBIC ACID) 500 MG tablet, Take 500 mg by mouth daily.   Reviewed prior external information including notes and imaging from  primary care provider As well as notes that were available from care everywhere and other healthcare systems.  Past medical history, social, surgical and family history all reviewed in  electronic medical record.  No pertanent information unless stated regarding to the chief complaint.   Review of Systems:  No headache, visual changes, nausea, vomiting, diarrhea, constipation, dizziness, abdominal pain, skin rash, fevers, chills, night sweats, weight loss, swollen lymph nodes, body aches, joint swelling, chest pain, shortness of breath, mood changes. POSITIVE muscle aches  Objective  Blood pressure 102/72, pulse 83, height 5\' 4"  (1.626 m), weight 122 lb (55.3 kg), SpO2 97%.   General: No apparent distress alert and oriented x3 mood and affect normal, dressed appropriately.  HEENT: Pupils equal, extraocular movements intact  Respiratory: Patient's speak in full sentences and does not appear short of breath  Cardiovascular: No lower extremity edema, non tender, no erythema  Patient is wearing a  large brace on her right elbow. Left shoulder does have some weakness noted but voluntary guarding noted.  Does have some tightness noted in all planes but patient actively has great range of motion that is even improved from previous exam.  Limited muscular skeletal ultrasound was performed and interpreted by Antoine Primas, M   Limited ultrasound shows the patient does have some hypoechoic changes noted of the bicep tendon sheath.  Potential some mild tearing of the anterior labral area.  Patient does show some tearing of the subscapularis but unfortunately has some hypoechoic changes noted of the supraspinatus that could be potentially acute on chronic.    Impression and Recommendations:    The above documentation has been reviewed and is accurate and complete Judi Saa, DO

## 2023-07-28 ENCOUNTER — Other Ambulatory Visit: Payer: Self-pay

## 2023-07-28 ENCOUNTER — Encounter: Payer: Self-pay | Admitting: Family Medicine

## 2023-07-28 ENCOUNTER — Ambulatory Visit: Payer: Medicare Other | Admitting: Family Medicine

## 2023-07-28 VITALS — BP 102/72 | HR 83 | Ht 64.0 in | Wt 122.0 lb

## 2023-07-28 DIAGNOSIS — G8929 Other chronic pain: Secondary | ICD-10-CM

## 2023-07-28 DIAGNOSIS — M75112 Incomplete rotator cuff tear or rupture of left shoulder, not specified as traumatic: Secondary | ICD-10-CM

## 2023-07-28 DIAGNOSIS — M25512 Pain in left shoulder: Secondary | ICD-10-CM | POA: Diagnosis not present

## 2023-07-28 NOTE — Assessment & Plan Note (Addendum)
Discussed which activities could be better.  Discussed with patient that although with the ultrasound I do see that there is still some tearing noted.  Difficult to tell if this was new or not.  Was not seen previously secondary to this probably the hypoechoic changes and swelling that was noted previously.  Discussed which activities to do remain otherwise stable.  Hold on the PRP injection with patient still feeling from her recent with a count but I do not think that that is likely on right now.  Discussed icing regimen and home exercises.  Discussed avoiding certain activities.  Follow-up with me again 8 weeks when she is out of the brace and we can discuss either PRP or if we need to surgical intervention.  Patient is a for any acute changes to call us sooner.  Total time with patient as well as reviewing surgical notes, outpatient orthopedic notes, and discussing with patient 32 minutes

## 2023-07-28 NOTE — Patient Instructions (Signed)
You will be ok Let's get through elbow 1st Keep doing exercises Can do tricep we discussed Keep shoulder blades back See me in 2 months

## 2023-08-04 ENCOUNTER — Ambulatory Visit: Payer: Medicare Other | Admitting: Family Medicine

## 2023-08-08 ENCOUNTER — Ambulatory Visit (INDEPENDENT_AMBULATORY_CARE_PROVIDER_SITE_OTHER): Payer: Medicare Other | Admitting: Internal Medicine

## 2023-08-08 ENCOUNTER — Encounter: Payer: Self-pay | Admitting: Internal Medicine

## 2023-08-08 VITALS — BP 132/86 | HR 80 | Temp 98.0°F | Ht 64.0 in | Wt 115.0 lb

## 2023-08-08 DIAGNOSIS — M8580 Other specified disorders of bone density and structure, unspecified site: Secondary | ICD-10-CM

## 2023-08-08 DIAGNOSIS — E039 Hypothyroidism, unspecified: Secondary | ICD-10-CM

## 2023-08-08 DIAGNOSIS — I10 Essential (primary) hypertension: Secondary | ICD-10-CM

## 2023-08-08 DIAGNOSIS — R739 Hyperglycemia, unspecified: Secondary | ICD-10-CM

## 2023-08-08 MED ORDER — BENAZEPRIL HCL 40 MG PO TABS: ORAL_TABLET | ORAL | 3 refills | Status: DC

## 2023-08-08 NOTE — Progress Notes (Signed)
Patient ID: Cassandra Garner, female   DOB: Jul 14, 1956, 67 y.o.   MRN: [id number]         Chief Complaint:: yearly exam       HPI:  Cassandra Garner is a 67 y.o. female overall doing ok,  Pt denies chest pain, increased sob or doe, wheezing, orthopnea, PND, increased LE swelling, palpitations, dizziness or syncope.   Pt denies polydipsia, polyuria, or new focal neuro s/s.   Pt denies fever, wt loss, night sweats, loss of appetite, or other constitutional symptoms   Lost wt with better diet.  Denies worsening depressive symptoms, suicidal ideation, or panic; has ongoing anxiety, overall stable.  Wt Readings from Last 3 Encounters:  08/08/23 115 lb (52.2 kg)  07/28/23 122 lb (55.3 kg)  06/16/23 125 lb (56.7 kg)   BP Readings from Last 3 Encounters:  08/08/23 132/86  07/28/23 102/72  06/16/23 112/62   Immunization History  Administered Date(s) Administered   Tdap 04/19/2008, 07/10/2018   Health Maintenance Due  Topic Date Due   Medicare Annual Wellness (AWV)  Never done   COVID-19 Vaccine (1) Never done   Zoster Vaccines- Shingrix (1 of 2) Never done   Pneumonia Vaccine 59+ Years old (1 of 1 - PCV) Never done   INFLUENZA VACCINE  Never done      Past Medical History:  Diagnosis Date   Diverticulitis 01/10/2018   Hypertension    PTSD (post-traumatic stress disorder)    Past Surgical History:  Procedure Laterality Date   PLACEMENT OF BREAST IMPLANTS     known ruptures   RHINOPLASTY      reports that she has never smoked. She has never used smokeless tobacco. She reports current alcohol use. She reports that she does not use drugs. family history includes Hypertension in her father; Parkinson's disease in her mother. Allergies  Allergen Reactions   Amlodipine     Dizzy, palpitations   Fentanyl Other (See Comments)   Other Other (See Comments)   Penicillins Other (See Comments)    Has patient had a PCN reaction causing immediate rash, facial/tongue/throat swelling, SOB  or lightheadedness with hypotension: Unknown Has patient had a PCN reaction causing severe rash involving mucus membranes or skin necrosis: No Has patient had a PCN reaction that required hospitalization: No Has patient had a PCN reaction occurring within the last 10 years: No If all of the above answers are "NO", then may proceed with Cephalosporin use.    Albuterol Palpitations   Sulfa Antibiotics Rash   Current Outpatient Medications on File Prior to Visit  Medication Sig Dispense Refill   cholecalciferol (VITAMIN D3) 25 MCG (1000 UT) tablet Take 1,000 Units by mouth daily. (Patient not taking: Reported on 08/08/2023)     No current facility-administered medications on file prior to visit.        ROS:  All others reviewed and negative.  Objective        PE:  BP 132/86 (BP Location: Left Arm, Patient Position: Sitting, Cuff Size: Normal)   Pulse 80   Temp 98 F (36.7 C) (Oral)   Ht 5\' 4"  (1.626 m)   Wt 115 lb (52.2 kg)   SpO2 97%   BMI 19.74 kg/m                 Constitutional: Pt appears in NAD               HENT: Head: NCAT.  Right Ear: External ear normal.                 Left Ear: External ear normal.                Eyes: . Pupils are equal, round, and reactive to light. Conjunctivae and EOM are normal               Nose: without d/c or deformity               Neck: Neck supple. Gross normal ROM               Cardiovascular: Normal rate and regular rhythm.                 Pulmonary/Chest: Effort normal and breath sounds without rales or wheezing.                Abd:  Soft, NT, ND, + BS, no organomegaly               Neurological: Pt is alert. At baseline orientation, motor grossly intact               Skin: Skin is warm. No rashes, no other new lesions, LE edema - none               Psychiatric: Pt behavior is normal without agitation   Micro: none  Cardiac tracings I have personally interpreted today:  none  Pertinent Radiological findings  (summarize): none   Lab Results  Component Value Date   WBC 5.3 08/04/2022   HGB 14.6 08/04/2022   HCT 43.6 08/04/2022   PLT 256.0 08/04/2022   GLUCOSE 90 08/04/2022   CHOL 167 08/04/2022   TRIG 132.0 08/04/2022   HDL 66.90 08/04/2022   LDLCALC 74 08/04/2022   ALT 15 08/04/2022   AST 19 08/04/2022   NA 141 08/04/2022   K 3.9 08/04/2022   CL 105 08/04/2022   CREATININE 0.70 08/04/2022   BUN 10 08/04/2022   CO2 30 08/04/2022   TSH 4.01 08/04/2022   HGBA1C 5.6 08/04/2022   Assessment/Plan:  Cassandra Garner is a 67 y.o. White or Caucasian [1] female with  has a past medical history of Diverticulitis (01/10/2018), Hypertension, and PTSD (post-traumatic stress disorder).  Essential (primary) hypertension BP Readings from Last 3 Encounters:  08/08/23 132/86  07/28/23 102/72  06/16/23 112/62   Stable, pt to continue medical treatment lotensin 40 mg qd   Hypothyroidism, unspecified Lab Results  Component Value Date   TSH 4.01 08/04/2022   Stable, pt currently without tx need,  to f/u any worsening symptoms or concerns   Hyperglycemia Lab Results  Component Value Date   HGBA1C 5.6 08/04/2022   Stable, pt to continue current medical treatment  - diet,wt control   Osteopenia Pt declines fosamax or f/u dxa at this time, for f/u dxa in 1 yr  Followup: Return in about 1 year (around 08/07/2024).  Oliver Barre, MD 08/09/2023 8:32 PM [facility]

## 2023-08-08 NOTE — Patient Instructions (Signed)
Please continue all other medications as before, and refills have been done if requested.  Please have the pharmacy call with any other refills you may need.  Please continue your efforts at being more active, low cholesterol diet, and weight control.  Please keep your appointments with your specialists as you may have planned  No further lab work needed today  We will need to consider having the follow up bone density done next year, and maybe consider Prolia if worsening  Please make an Appointment to return for your 1 year visit, or sooner if needed

## 2023-08-09 ENCOUNTER — Encounter: Payer: Self-pay | Admitting: Internal Medicine

## 2023-08-09 NOTE — Assessment & Plan Note (Signed)
Lab Results  Component Value Date   TSH 4.01 08/04/2022   Stable, pt currently without tx need,  to f/u any worsening symptoms or concerns

## 2023-08-09 NOTE — Assessment & Plan Note (Signed)
BP Readings from Last 3 Encounters:  08/08/23 132/86  07/28/23 102/72  06/16/23 112/62   Stable, pt to continue medical treatment lotensin 40 mg qd

## 2023-08-09 NOTE — Assessment & Plan Note (Signed)
Pt declines fosamax or f/u dxa at this time, for f/u dxa in 1 yr

## 2023-08-09 NOTE — Assessment & Plan Note (Signed)
Lab Results  Component Value Date   HGBA1C 5.6 08/04/2022   Stable, pt to continue current medical treatment  - diet, wt control

## 2023-09-21 NOTE — Progress Notes (Unsigned)
[facility] [address] [place] [address] Phone: [phone] Subjective:   Cassandra Garner, am serving as a scribe for Dr. Antoine Garner.  I'm seeing this patient by the request  of:  Cassandra Levins, MD  CC: Left shoulder pain follow-up  [id number]  07/28/2023 Discussed which activities could be better.  Discussed with patient that although with the ultrasound I do see that there is still some tearing noted.  Difficult to tell if this was new or not.  Was not seen previously secondary to this probably the hypoechoic changes and swelling that was noted previously.  Discussed which activities to do remain otherwise stable.  Hold on the PRP injection with patient still feeling from her recent with a count but I do not think that that is likely on right now.  Discussed icing regimen and home exercises.  Discussed avoiding certain activities.  Follow-up with me again 8 weeks when she is out of the brace and we can discuss either PRP or if we need to surgical intervention.  Patient is a for any acute changes to call us sooner.  Total time with patient as well as reviewing surgical notes, outpatient orthopedic notes, and discussing with patient 32 minutes   Update 09/22/2023 Cassandra Garner is a 68 y.o. female coming in with complaint of L shoulder pain. Patient states that her shoulder is slowly improving but she has not been using it as much due to R elbow pain. Decreased ROM and painful to flex shoulder.     Past Medical History:  Diagnosis Date   Diverticulitis 01/10/2018   Hypertension    PTSD (post-traumatic stress disorder)    Past Surgical History:  Procedure Laterality Date   PLACEMENT OF BREAST IMPLANTS     known ruptures   RHINOPLASTY     Social History   Socioeconomic History   Marital status: Married    Spouse name: Not on file   Number of children: Not on file   Years of education: Not on file   Highest education level: Not on  file  Occupational History   Not on file  Tobacco Use   Smoking status: Never   Smokeless tobacco: Never  Vaping Use   Vaping status: Never Used  Substance and Sexual Activity   Alcohol use: Yes    Comment: 1-2 glass wine per wk   Drug use: Never   Sexual activity: Not Currently    Partners: Male  Other Topics Concern   Not on file  Social History Narrative   Lives with husband      One story home      Right handed      Highest level of edu- bachelors      Works part time   Social Drivers of Health   Financial Resource Strain: Patient Declined (04/13/2022)   Received [facility], [facility]   Overall Financial Resource Strain (CARDIA)    Difficulty of Paying Living Expenses: Patient declined  Food Insecurity: Patient Declined (04/13/2022)   Received from [facility], [facility]   Hunger Vital Sign    Worried About Running Out of Food in the Last Year: Patient declined    Ran Out of Food in the Last Year: Patient declined  Transportation Needs: No Transportation Needs (04/13/2022)   Received from [facility], [facility]   [facility] - Transportation    Lack of Transportation (Medical): No    Lack of Transportation (Non-Medical): No  Physical Activity: Sufficiently Active (04/13/2022)   Received from [facility], [facility]   Exercise Vital Sign    Days of Exercise per Week: 7 days    Minutes of Exercise per Session: 30 min  Stress: No Stress Concern Present (03/07/2021)   Received from [facility], [facility] of Occupational Health - Occupational Stress Questionnaire    Feeling of Stress : Not at all  Social Connections: Moderately Integrated (03/07/2021)   Received from [facility], [facility]   Social Connection and Isolation Panel [NHANES]    Frequency of Communication with Friends and Family: Twice a week    Frequency of Social Gatherings with Friends and Family: Once a week    Attends Religious Services: Never    Doctor, general practice or Organizations: Yes    Attends Engineer, structural: More than 4 times per year    Marital Status: Married   Allergies  Allergen Reactions   Amlodipine     Dizzy, palpitations   Fentanyl Other (See Comments)   Other Other (See Comments)   Penicillins Other (See Comments)    Has patient had a PCN reaction causing immediate rash, facial/tongue/throat swelling, SOB or lightheadedness with hypotension: Unknown Has patient had a PCN reaction causing severe rash involving mucus membranes or skin necrosis: No Has patient had a PCN reaction that required hospitalization: No Has patient had a PCN reaction occurring within the last 10 years: No If all of the above answers are "NO", then may proceed with Cephalosporin use.    Albuterol Palpitations   Sulfa Antibiotics Rash   Family History  Problem Relation Age of Onset   Parkinson's disease Mother    Hypertension Father      Current Outpatient Medications (Cardiovascular):    benazepril (LOTENSIN) 40 MG tablet, TAKE 1 TABLET BY MOUTH EVERY DAY. APPOINTMENT DUE IN [place]   Current Outpatient Medications (Analgesics):    meloxicam (MOBIC) 15 MG tablet, Take 1 tablet (15 mg total) by mouth daily.   Current Outpatient Medications (Other):    cholecalciferol (VITAMIN D3) 25 MCG (1000 UT) tablet, Take 1,000 Units by mouth daily.   Reviewed prior external information including notes and imaging from  primary care provider As well as notes that were available from care everywhere and other healthcare systems.  Past medical history, social, surgical and family history all reviewed in electronic medical record.  No pertanent information unless stated regarding to the chief complaint.   Review of Systems:  No headache, visual changes, nausea, vomiting, diarrhea, constipation, dizziness, abdominal pain, skin rash, fevers, chills, night sweats, weight loss, swollen lymph nodes, body aches, joint swelling, chest pain, shortness  of breath, mood changes. POSITIVE muscle aches  Objective  Blood pressure 120/82, pulse 73, height 5\' 4"  (1.626 m), weight 123 lb (55.8 kg), SpO2 98%.   General: No apparent distress alert and oriented x3 mood and affect normal, dressed appropriately.  HEENT: Pupils equal, extraocular movements intact  Respiratory: Patient's speak in full sentences and does not appear short of breath  Cardiovascular: No lower extremity edema, non tender, no erythema  Left shoulder exam does have positive impingement noted.  Patient though does have still some limited range of motion and just internal range of motion but is significant improvement in external range of motion from previous exam.  Limited muscular skeletal ultrasound was performed and interpreted by Cassandra Garner, M  Limited ultrasound shows the patient does have hypoechoic changes still significantly in  the bicep tendon but actually seems that the rotator cuff was improved with more scar tissue formation.  Patient does have an irregularity noted of the anterior labrum. 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  Interval improvement of the rotator cuff    Impression and Recommendations:    The above documentation has been reviewed and is accurate and complete Judi Saa, DO

## 2023-09-22 ENCOUNTER — Other Ambulatory Visit: Payer: Self-pay

## 2023-09-22 ENCOUNTER — Encounter: Payer: Self-pay | Admitting: Family Medicine

## 2023-09-22 ENCOUNTER — Ambulatory Visit: Payer: Medicare Other | Admitting: Family Medicine

## 2023-09-22 VITALS — BP 120/82 | HR 73 | Ht 64.0 in | Wt 123.0 lb

## 2023-09-22 DIAGNOSIS — M25512 Pain in left shoulder: Secondary | ICD-10-CM

## 2023-09-22 DIAGNOSIS — M75112 Incomplete rotator cuff tear or rupture of left shoulder, not specified as traumatic: Secondary | ICD-10-CM

## 2023-09-22 MED ORDER — MELOXICAM 15 MG PO TABS: 15.0000 mg | ORAL_TABLET | Freq: Every day | ORAL | 0 refills | Status: DC

## 2023-09-22 NOTE — Assessment & Plan Note (Signed)
Patient was given the PRP and is 3 months out since that time.  Seems that the rotator cuff seems to be doing relatively well but unfortunately continues to have reactive swelling noted of the bicep tendon.  Discussed with patient about potentially considering different treatment options.  We elected not to try an aspiration secondary to the closeness of the tendon itself.  Patient is going to try more of a compression sleeve, icing regimen and given meloxicam 15 mg to take daily for the next 10 days then as needed.  Increase activity slowly otherwise.  Follow-up again in 6 to 8 weeks.

## 2023-09-22 NOTE — Patient Instructions (Signed)
Stainless steel ice Ice 20 min 2x a day Arm compression with activity Meloxicam 15mg  for 10 days then as needed Voltaren gel  See me again in 2-3 months

## 2023-09-28 ENCOUNTER — Encounter: Payer: Self-pay | Admitting: Internal Medicine

## 2023-09-28 DIAGNOSIS — L989 Disorder of the skin and subcutaneous tissue, unspecified: Secondary | ICD-10-CM

## 2023-10-04 DIAGNOSIS — G5601 Carpal tunnel syndrome, right upper limb: Secondary | ICD-10-CM | POA: Insufficient documentation

## 2023-11-23 ENCOUNTER — Ambulatory Visit (INDEPENDENT_AMBULATORY_CARE_PROVIDER_SITE_OTHER): Admitting: Dermatology

## 2023-11-23 ENCOUNTER — Encounter: Payer: Self-pay | Admitting: Dermatology

## 2023-11-23 VITALS — BP 124/72 | HR 69

## 2023-11-23 DIAGNOSIS — L249 Irritant contact dermatitis, unspecified cause: Secondary | ICD-10-CM | POA: Diagnosis not present

## 2023-11-23 DIAGNOSIS — L309 Dermatitis, unspecified: Secondary | ICD-10-CM

## 2023-11-23 MED ORDER — HYDROCORTISONE 2.5 % EX OINT: TOPICAL_OINTMENT | Freq: Two times a day (BID) | CUTANEOUS | 3 refills | Status: DC

## 2023-11-23 NOTE — Progress Notes (Signed)
 New Patient Visit   Subjective  Cassandra Garner is a 68 y.o. female who presents for the following: rash Pt had a rash on her face that started the middle of January. She started using Cerave healing ointment around 4 weeks ago, slathering it on daily, and it seemed to resolve issue. Pt has photos of rash when it was bad. When rash was bad, she was washing with epicurin facial scrub and continues to use it. (She also uses apricot face wash) It itched and burned and her lips would crack.   The patient presents with a history of facial skin issues. Previously, in February, she experienced very dry, scaly plaques surrounding the nose, cheeks, jawline, mouth, and eyes. The patient describes having splits that were scabby and bleeding, with flaky skin in the nostrils. She reports that the condition was not particularly itchy but was burning/ painful, interfering with facial expressions and smiling.  The patient was diagnosed with eczema around 2017, with a previous dermatologist suggesting it could be eczema or irritant contact dermatitis. She notes a history of eczema around her mouth years ago. In January, she used a new shampoo from a hairdresser, which may have been a potential trigger. The patient also mentions using tretinoin in December, which she believes caused a reaction, prompting her to discontinue use.  The patient reports using Epicurean products, including an apricot facial scrub. She has been applying CeraVe Healing Ointment to manage her current symptoms. She notes a history of using Retin-A long-term in the past and receiving a filler four years ago, at which time she was prescribed tretinoin.  Medical History - Eczema (diagnosed around 2017) - Possible irritant contact dermatitis (previously diagnosed by another dermatologist)   The following portions of the chart were reviewed this encounter and updated as appropriate: medications, allergies, medical history  Review of  Systems:  No other skin or systemic complaints except as noted in HPI or Assessment and Plan.  Objective  Well appearing patient in no apparent distress; mood and affect are within normal limits.  A focused examination was performed of the following areas: face  Relevant exam findings are noted in the Assessment and Plan.  Exam: Photos showing pink scaly plaques on nose, cheeks, jawline --clear today    Assessment & Plan    IRRITANT CONTACT DERMATITIS  - Assessment: Patient presented with facial dermatitis, initially diagnosed as eczema in 2017. Recent exacerbation in February 2025 involved dry, scaly plaques surrounding the nose, cheeks, jawline, mouth, and eyes, with associated splitting, scabbing, and bleeding. . Tretinoin use in December identified as a likely trigger, compromising the skin barrier. Current symptoms are resolving with the use of CeraVe Healing Ointment.  - Plan:    Continue CeraVe Healing Ointment daily until skin is completely healed    Prescribe hydrocortisone 2.5% ointment for potential future flares, to be used for no more than 7 days    Recommend Avene products for skincare routine, including face wash, moisturizer, and thermal spray    Advise limiting use of current apricot facial scrub to once weekly    Suggest CeraVe emulsion and Ciclefate recovery balm for daily skin hydration    Provide samples of Neutrogena mineral sunscreen (SPF 30 or higher)    Defer fluorouracil treatment until skin is completely healed and preferably between October and March  Follow up in 3 months to assess progress.   No follow-ups on file.  Owens Shark, CMA, am acting as scribe for [employer], DO.  Documentation: I have reviewed the above documentation for accuracy and completeness, and I agree with the above.  Langston Reusing, DO

## 2023-11-23 NOTE — Patient Instructions (Addendum)
 Hello  Cassandra Garner,  Thank you for visiting today. Here is a summary of the key instructions:  - Skin Care:   - Continue using CeraVe Healing Ointment daily until skin is completely healed   - Stop using Apricot facial scrub more than once a week   - Try Avene products for face wash, moisturizer, and thermal spray   - Use CeraVe Healing Ointment as needed    - Apply Avene Ciclefate recovery balm nightly after regular moisturizer  - Medications:   - Use hydrocortisone 2.5% ointment for flare-ups, no more than 7 days   - Apply hydrocortisone 2.5% ointment to mouth corners if irritated  - Sun Protection:   - Use mineral sunscreen with SPF 30 or higher daily   - Wear hats and sunglasses for additional sun protection  - Follow-up:   - Return in 3 months for a check-up   - Avoid:   - Do not use tretinoin or Retin-A products   - Postpone fluorouracil treatment until skin is completely healed  Please reach out if you have any questions or concerns.  Warm regards,  Dr. [facility] Reusing [facility]        Important Information   Due to recent changes in healthcare laws, you may see results of your pathology and/or laboratory studies on MyChart before the doctors have had a chance to review them. We understand that in some cases there may be results that are confusing or concerning to you. Please understand that not all results are received at the same time and often the doctors may need to interpret multiple results in order to provide you with the best plan of care or course of treatment. Therefore, we ask that you please give [place] 2 business days to thoroughly review all your results before contacting the office for clarification. Should we see a critical lab result, you will be contacted sooner.     If You Need Anything After Your Visit   If you have any questions or concerns for your doctor, please call our main line at [phone]. If no one answers, please leave a voicemail as  directed and we will return your call as soon as possible. Messages left after 4 pm will be answered the following business day.    You may also send [place] a message via MyChart. We typically respond to MyChart messages within 1-2 business days.  For prescription refills, please ask your pharmacy to contact our office. Our fax number is [phone].  If you have an urgent issue when the clinic is closed that cannot wait until the next business day, you can page your doctor at the number below.     Please note that while we do our best to be available for urgent issues outside of office hours, we are not available 24/7.    If you have an urgent issue and are unable to reach [place], you may choose to seek medical care at your doctor's office, retail clinic, urgent care center, or emergency room.   If you have a medical emergency, please immediately call 911 or go to the emergency department. In the event of inclement weather, please call our main line at [phone] for an update on the status of any delays or closures.  Dermatology Medication Tips: Please keep the boxes that topical medications come in in order to help keep track of the instructions about where and how to use these. Pharmacies typically print the medication instructions only on the boxes and not  directly on the medication tubes.   If your medication is too expensive, please contact our office at [phone] or send [place] a message through MyChart.    We are unable to tell what your co-pay for medications will be in advance as this is different depending on your insurance coverage. However, we may be able to find a substitute medication at lower cost or fill out paperwork to get insurance to cover a needed medication.    If a prior authorization is required to get your medication covered by your insurance company, please allow [place] 1-2 business days to complete this process.   Drug prices often vary depending on where the prescription  is filled and some pharmacies may offer cheaper prices.   The website www.goodrx.com contains coupons for medications through different pharmacies. The prices here do not account for what the cost may be with help from insurance (it may be cheaper with your insurance), but the website can give you the price if you did not use any insurance.  - You can print the associated coupon and take it with your prescription to the pharmacy.  - You may also stop by our office during regular business hours and pick up a GoodRx coupon card.  - If you need your prescription sent electronically to a different pharmacy, notify our office through [facility] or by phone at [phone]

## 2023-11-23 NOTE — Progress Notes (Unsigned)
 [facility] [address] [place] [address] Phone: [phone] Subjective:   [name], am serving as a scribe for Dr. Antoine Primas.  I'm seeing this patient by the request  of:  Corwin Levins, MD  CC: Left shoulder pain follow-up  [id number]  09/22/2023 Patient was given the PRP and is 3 months out since that time.  Seems that the rotator cuff seems to be doing relatively well but unfortunately continues to have reactive swelling noted of the bicep tendon.  Discussed with patient about potentially considering different treatment options.  We elected not to try an aspiration secondary to the closeness of the tendon itself.  Patient is going to try more of a compression sleeve, icing regimen and given meloxicam 15 mg to take daily for the next 10 days then as needed.  Increase activity slowly otherwise.  Follow-up again in 6 to 8 weeks.      Update 11/24/2023 Cassandra Garner is a 68 y.o. female coming in with complaint of L RTC tear. Patient states that she has been doing ok but not 100%. Pain in anterior aspect when she has pain.       Past Medical History:  Diagnosis Date   Diverticulitis 01/10/2018   Hypertension    PTSD (post-traumatic stress disorder)    Past Surgical History:  Procedure Laterality Date   PLACEMENT OF BREAST IMPLANTS     known ruptures   RHINOPLASTY     Social History   Socioeconomic History   Marital status: Married    Spouse name: Not on file   Number of children: Not on file   Years of education: Not on file   Highest education level: Not on file  Occupational History   Not on file  Tobacco Use   Smoking status: Never   Smokeless tobacco: Never  Vaping Use   Vaping status: Never Used  Substance and Sexual Activity   Alcohol use: Yes    Comment: 1-2 glass wine per wk   Drug use: Never   Sexual activity: Not Currently    Partners: Male  Other Topics Concern   Not on file  Social History  Narrative   Lives with husband      One story home      Right handed      Highest level of edu- bachelors      Works part time   Social Drivers of Health   Financial Resource Strain: Patient Declined (04/13/2022)   Received from [facility], [facility]   Overall Financial Resource Strain (CARDIA)    Difficulty of Paying Living Expenses: Patient declined  Food Insecurity: Patient Declined (11/14/2023)   Received from [facility]   Hunger Vital Sign    Worried About Running Out of Food in the Last Year: Patient declined    Ran Out of Food in the Last Year: Patient declined  Transportation Needs: No Transportation Needs (11/14/2023)   Received from [facility] - Transportation    Lack of Transportation (Medical): No    Lack of Transportation (Non-Medical): No  Physical Activity: Patient Declined (11/14/2023)   Received from [facility]   Exercise Vital Sign    Days of Exercise per Week: Patient declined    Minutes of Exercise per Session: Patient declined  Stress: No Stress Concern Present (03/07/2021)   Received from [facility], [facility] of Occupational Health - Occupational Stress Questionnaire  Feeling of Stress : Not at all  Social Connections: Moderately Integrated (03/07/2021)   Received from [facility], [facility]   Social Connection and Isolation Panel [NHANES]    Frequency of Communication with Friends and Family: Twice a week    Frequency of Social Gatherings with Friends and Family: Once a week    Attends Religious Services: Never    Database administrator or Organizations: Yes    Attends Engineer, structural: More than 4 times per year    Marital Status: Married   Allergies  Allergen Reactions   Amlodipine     Dizzy, palpitations   Fentanyl Other (See Comments)   Other Other (See Comments)   Penicillins Other (See Comments)    Has patient had a PCN reaction causing immediate rash, facial/tongue/throat swelling,  SOB or lightheadedness with hypotension: Unknown Has patient had a PCN reaction causing severe rash involving mucus membranes or skin necrosis: No Has patient had a PCN reaction that required hospitalization: No Has patient had a PCN reaction occurring within the last 10 years: No If all of the above answers are "NO", then may proceed with Cephalosporin use.    Albuterol Palpitations   Sulfa Antibiotics Rash   Family History  Problem Relation Age of Onset   Parkinson's disease Mother    Hypertension Father      Current Outpatient Medications (Cardiovascular):    benazepril (LOTENSIN) 40 MG tablet, TAKE 1 TABLET BY MOUTH EVERY DAY. APPOINTMENT DUE IN [place]   Current Outpatient Medications (Analgesics):    meloxicam (MOBIC) 15 MG tablet, Take 1 tablet (15 mg total) by mouth daily.   Current Outpatient Medications (Other):    cholecalciferol (VITAMIN D3) 25 MCG (1000 UT) tablet, Take 1,000 Units by mouth daily.   hydrocortisone 2.5 % ointment, Apply topically 2 (two) times daily.   Reviewed prior external information including notes and imaging from  primary care provider As well as notes that were available from care everywhere and other healthcare systems.  Past medical history, social, surgical and family history all reviewed in electronic medical record.  No pertanent information unless stated regarding to the chief complaint.   Review of Systems:  No headache, visual changes, nausea, vomiting, diarrhea, constipation, dizziness, abdominal pain, skin rash, fevers, chills, night sweats, weight loss, swollen lymph nodes, body aches, joint swelling, chest pain, shortness of breath, mood changes. POSITIVE muscle aches  Objective  Blood pressure 120/72, pulse 70, height 5\' 4"  (1.626 m), weight 119 lb (54 kg), SpO2 97%.   General: No apparent distress alert and oriented x3 mood and affect normal, dressed appropriately.  HEENT: Pupils equal, extraocular movements intact   Respiratory: Patient's speak in full sentences and does not appear short of breath  Cardiovascular: No lower extremity edema, non tender, no erythema  Left shoulder exam shows the patient does have great range of motion noted.  Does have some voluntary guarding noted in all planes though.  No significant weakness noted on exam specifically.  Limited muscular skeletal ultrasound was performed and interpreted by Antoine Primas, M  Limited exams shows that there is some changes intersubstance tearing noted of the supraspinatus.  In addition of this does seem that there is a torn of the posterior labrum.  Hypoechoic changes in position. Impression: Supraspinatus tear noted with labral pathology.    Impression and Recommendations:    The above documentation has been reviewed and is accurate and complete Judi Saa, DO

## 2023-11-24 ENCOUNTER — Other Ambulatory Visit: Payer: Self-pay

## 2023-11-24 ENCOUNTER — Ambulatory Visit: Payer: Medicare Other | Admitting: Family Medicine

## 2023-11-24 ENCOUNTER — Encounter: Payer: Self-pay | Admitting: Family Medicine

## 2023-11-24 VITALS — BP 120/72 | HR 70 | Ht 64.0 in | Wt 119.0 lb

## 2023-11-24 DIAGNOSIS — M75112 Incomplete rotator cuff tear or rupture of left shoulder, not specified as traumatic: Secondary | ICD-10-CM | POA: Diagnosis not present

## 2023-11-24 DIAGNOSIS — M25512 Pain in left shoulder: Secondary | ICD-10-CM

## 2023-11-24 DIAGNOSIS — G8929 Other chronic pain: Secondary | ICD-10-CM

## 2023-11-24 NOTE — Patient Instructions (Addendum)
 Great to see you gold medal winner!!!!! Lets set you up for PRP again and we will do it in the tendon and the labrum  Look at your schedule and we will get it done! I

## 2023-11-24 NOTE — Assessment & Plan Note (Signed)
 Tear still noted, discussed HEP discussed which activities to do and which ones to avoid.  Increase activity as tolerated  Discussed repeat PRP and will schedule. Discussed which activities to do and which ones to avoid.  Patient also getting the potential CNS surgery which patient wants to avoid because she did not have a great success on the contralateral side.

## 2023-12-24 ENCOUNTER — Telehealth: Admitting: Nurse Practitioner

## 2023-12-24 ENCOUNTER — Other Ambulatory Visit: Payer: Self-pay | Admitting: Nurse Practitioner

## 2023-12-24 ENCOUNTER — Encounter: Payer: Self-pay | Admitting: Nurse Practitioner

## 2023-12-24 DIAGNOSIS — U071 COVID-19: Secondary | ICD-10-CM

## 2023-12-24 MED ORDER — BENZONATATE 200 MG PO CAPS
200.0000 mg | ORAL_CAPSULE | Freq: Two times a day (BID) | ORAL | 0 refills | Status: DC | PRN
Start: 2023-12-24 — End: 2024-05-31

## 2023-12-24 MED ORDER — NIRMATRELVIR/RITONAVIR (PAXLOVID)TABLET: 3.0000 | ORAL_TABLET | Freq: Two times a day (BID) | ORAL | 0 refills | Status: AC

## 2023-12-24 MED ORDER — NIRMATRELVIR/RITONAVIR (PAXLOVID)TABLET: 3.0000 | ORAL_TABLET | Freq: Two times a day (BID) | ORAL | 0 refills | Status: DC
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
## 2023-12-24 NOTE — Patient Instructions (Signed)
 [name], thank you for joining [name], NP for today's virtual visit.  While this provider is not your primary care provider (PCP), if your PCP is located in our provider database this encounter information will be shared with them immediately following your visit.   A [facility] MyChart account gives you access to today's visit and all your visits, tests, and labs performed at [facility] " click here if you don't have a [facility] MyChart account or go to [email or web address]  Consent: (Patient) Cassandra Garner provided verbal consent for this virtual visit at the beginning of the encounter.  Current Medications:  Current Outpatient Medications:    nirmatrelvir/ritonavir (PAXLOVID) 20 x 150 MG & 10 x 100MG  TABS, Take 3 tablets by mouth 2 (two) times daily for 5 days. (Take nirmatrelvir 150 mg two tablets twice daily for 5 days and ritonavir 100 mg one tablet twice daily for 5 days) Patient GFR is 79, Disp: 30 tablet, Rfl: 0   benazepril  (LOTENSIN ) 40 MG tablet, TAKE 1 TABLET BY MOUTH EVERY DAY. APPOINTMENT DUE IN [place], Disp: 90 tablet, Rfl: 3   cholecalciferol (VITAMIN D3) 25 MCG (1000 UT) tablet, Take 1,000 Units by mouth daily., Disp: , Rfl:    hydrocortisone  2.5 % ointment, Apply topically 2 (two) times daily., Disp: 35 g, Rfl: 3   meloxicam  (MOBIC ) 15 MG tablet, Take 1 tablet (15 mg total) by mouth daily., Disp: 30 tablet, Rfl: 0   Medications ordered in this encounter:  Meds ordered this encounter  Medications   nirmatrelvir/ritonavir (PAXLOVID) 20 x 150 MG & 10 x 100MG  TABS    Sig: Take 3 tablets by mouth 2 (two) times daily for 5 days. (Take nirmatrelvir 150 mg two tablets twice daily for 5 days and ritonavir 100 mg one tablet twice daily for 5 days) Patient GFR is 79    Dispense:  30 tablet    Refill:  0    Supervising Provider:   Corine Dice [phone]     *If you need refills on other medications prior to your next appointment,  please contact your pharmacy*  Follow-Up: Call back or seek an in-person evaluation if the symptoms worsen or if the condition fails to improve as anticipated.  [facility] [phone]  Other Instructions  Please keep well-hydrated and get plenty of rest. Start a saline nasal rinse to flush out your nasal passages. You can use plain Mucinex to help thin congestion. If you have a humidifier, you can use this daily as needed.    You are to wear a mask for 5 days from onset of your symptoms.  After day 5, if you have had no fever and you are feeling better with NO symptoms, you can end masking. Keep in mind you can be contagious 10 days from the onset of symptoms  After day 5 if you have a fever or are having significant symptoms, please wear your mask for full 10 days.   If you note any worsening of symptoms, any significant shortness of breath or any chest pain, please seek ER evaluation ASAP.  Please do not delay care!    If you note any worsening of symptoms, any significant shortness of breath or any chest pain, please seek ER evaluation ASAP.  Please do not delay care!    If you have been instructed to have an in-person evaluation today at a local Urgent Care facility, please use the link below. It will take you to  a list of all of our available [facility], including address, phone number and hours of operation. Please do not delay care.  [facility]  If you or a family member do not have a primary care provider, use the link below to schedule a visit and establish care. When you choose a [facility] primary care physician or advanced practice provider, you gain a long-term partner in health. Find a Primary Care Provider  Learn more about [facility]'s in-office and virtual care options:  - Get Care Now

## 2024-01-10 NOTE — Progress Notes (Signed)
 [facility] [address] [place] [address] Phone: [phone] Subjective:   Cassandra Garner, am serving as a scribe for Dr. Ronnell Coins.  I'm seeing this patient by the request  of:  Roslyn Coombe, MD  CC: Left shoulder pain  [id number]  11/24/2023 Tear still noted, discussed HEP discussed which activities to do and which ones to avoid.  Increase activity as tolerated  Discussed repeat PRP and will schedule. Discussed which activities to do and which ones to avoid.  Patient also getting the potential CNS surgery which patient wants to avoid because she did not have a great success on the contralateral side.     Update 01/11/2024 Cassandra Garner is a 68 y.o. female coming in with complaint of  L shoulder pain. Patient states that she wanted her shoulder to heal to she has not been doing a lot with it.       Past Medical History:  Diagnosis Date   Diverticulitis 01/10/2018   Hypertension    PTSD (post-traumatic stress disorder)    Past Surgical History:  Procedure Laterality Date   PLACEMENT OF BREAST IMPLANTS     known ruptures   RHINOPLASTY     Social History   Socioeconomic History   Marital status: Married    Spouse name: Not on file   Number of children: Not on file   Years of education: Not on file   Highest education level: Not on file  Occupational History   Not on file  Tobacco Use   Smoking status: Never   Smokeless tobacco: Never  Vaping Use   Vaping status: Never Used  Substance and Sexual Activity   Alcohol use: Yes    Comment: 1-2 glass wine per wk   Drug use: Never   Sexual activity: Not Currently    Partners: Male  Other Topics Concern   Not on file  Social History Narrative   Lives with husband      One story home      Right handed      Highest level of edu- bachelors      Works part time   Social Drivers of Health   Financial Resource Strain: Patient Declined (04/13/2022)   Received  from [facility], [facility]   Overall Financial Resource Strain (CARDIA)    Difficulty of Paying Living Expenses: Patient declined  Food Insecurity: Patient Declined (11/14/2023)   Received from [facility]   Hunger Vital Sign    Worried About Running Out of Food in the Last Year: Patient declined    Ran Out of Food in the Last Year: Patient declined  Transportation Needs: No Transportation Needs (11/14/2023)   Received from [facility] - Transportation    Lack of Transportation (Medical): No    Lack of Transportation (Non-Medical): No  Physical Activity: Patient Declined (11/14/2023)   Received [facility]   Exercise Vital Sign    Days of Exercise per Week: Patient declined    Minutes of Exercise per Session: Patient declined  Stress: No Stress Concern Present (03/07/2021)   Received from [facility], [facility] of Occupational Health - Occupational Stress Questionnaire    Feeling of Stress : Not at all  Social Connections: Moderately Integrated (03/07/2021)   Received from [facility], [facility]   Social Connection and Isolation Panel [NHANES]    Frequency of Communication with Friends and Family: Twice a week  Frequency of Social Gatherings with Friends and Family: Once a week    Attends Religious Services: Never    Database administrator or Organizations: Yes    Attends Engineer, structural: More than 4 times per year    Marital Status: Married   Allergies  Allergen Reactions   Amlodipine     Dizzy, palpitations   Fentanyl  Other (See Comments)   Other Other (See Comments)   Penicillins Other (See Comments)    Has patient had a PCN reaction causing immediate rash, facial/tongue/throat swelling, SOB or lightheadedness with hypotension: Unknown Has patient had a PCN reaction causing severe rash involving mucus membranes or skin necrosis: No Has patient had a PCN reaction that required hospitalization: No Has patient had a PCN  reaction occurring within the last 10 years: No If all of the above answers are "NO", then may proceed with Cephalosporin use.    Albuterol Palpitations   Sulfa Antibiotics Rash   Family History  Problem Relation Age of Onset   Parkinson's disease Mother    Hypertension Father      Current Outpatient Medications (Cardiovascular):    benazepril  (LOTENSIN ) 40 MG tablet, TAKE 1 TABLET BY MOUTH EVERY DAY. APPOINTMENT DUE IN [place]  Current Outpatient Medications (Respiratory):    benzonatate  (TESSALON ) 200 MG capsule, Take 1 capsule (200 mg total) by mouth 2 (two) times daily as needed for cough.  Current Outpatient Medications (Analgesics):    meloxicam  (MOBIC ) 15 MG tablet, Take 1 tablet (15 mg total) by mouth daily.   Current Outpatient Medications (Other):    cholecalciferol (VITAMIN D3) 25 MCG (1000 UT) tablet, Take 1,000 Units by mouth daily.   hydrocortisone  2.5 % ointment, Apply topically 2 (two) times daily.    Objective  There were no vitals taken for this visit.   General: No apparent distress alert and oriented x3 mood and affect normal, dressed appropriately.   Procedure: Real-time Ultrasound Guided Injection of left supraspinatus tendon sheath Device: GE Logiq E  Ultrasound guided injection is preferred based studies that show increased duration, increased effect, greater accuracy, decreased procedural pain, increased response rate with ultrasound guided versus blind injection.  Verbal informed consent obtained.  Time-out conducted.  Noted no overlying erythema, induration, or other signs of local infection.  Skin prepped in a sterile fashion.  Local anesthesia: Topical Ethyl chloride.  With sterile technique and under real time ultrasound guidance:  Joint visualized.  21g 2 inch needle inserted lateral approach was initially injected with a 0.5 cc of 0.5% Marcaine then injected with 4 cc of PRP leukocyte rich Completed without difficulty  Pain immediately resolved  suggesting accurate placement of the medication.  Advised to call if fevers/chills, erythema, induration, drainage, or persistent bleeding.  Images permanently stored  Impression: Technically successful ultrasound guided injection.     Impression and Recommendations:     The above documentation has been reviewed and is accurate and complete Kimiyah Blick M Acadia Thammavong, DO

## 2024-01-11 ENCOUNTER — Ambulatory Visit: Payer: Self-pay | Admitting: Family Medicine

## 2024-01-11 ENCOUNTER — Telehealth: Payer: Self-pay | Admitting: Family Medicine

## 2024-01-11 ENCOUNTER — Other Ambulatory Visit: Payer: Self-pay

## 2024-01-11 ENCOUNTER — Encounter: Payer: Self-pay | Admitting: Family Medicine

## 2024-01-11 VITALS — BP 128/80 | HR 74 | Ht 64.0 in | Wt 124.0 lb

## 2024-01-11 DIAGNOSIS — M75112 Incomplete rotator cuff tear or rupture of left shoulder, not specified as traumatic: Secondary | ICD-10-CM

## 2024-01-11 DIAGNOSIS — G8929 Other chronic pain: Secondary | ICD-10-CM

## 2024-01-11 DIAGNOSIS — M25512 Pain in left shoulder: Secondary | ICD-10-CM

## 2024-01-11 NOTE — Telephone Encounter (Signed)
 Patient is asking when can she submerge her shoulder that she got PRP in water like a hot tub? Please advise.

## 2024-01-11 NOTE — Patient Instructions (Signed)
 Heat and Tylenol  are ok No ice or Ibu for 3 days See me again in 6-8 weeks

## 2024-01-11 NOTE — Assessment & Plan Note (Signed)
 Repeat injection given today, tolerated the procedure well, post PRP prescription given.

## 2024-02-21 NOTE — Progress Notes (Unsigned)
 [facility] [address] [place] [address] Phone: [phone] Subjective:   Cassandra Garner, am serving as a scribe for Dr. Arthea Claudene.  I'm seeing this patient by the request  of:  Cassandra Garner ORN, MD  CC: Left shoulder pain  YEP:[id number]  01/11/2024 Repeat injection given today, tolerated the procedure well, post PRP prescription given.      Update 02/22/2024 Cassandra Garner is a 68 y.o. female coming in with complaint of L shoulder pain. Patient states feels like its better. Has been careful. Question about R arm going numb and wants to get metal taken out.       Past Medical History:  Diagnosis Date   Diverticulitis 01/10/2018   Hypertension    PTSD (post-traumatic stress disorder)    Past Surgical History:  Procedure Laterality Date   PLACEMENT OF BREAST IMPLANTS     known ruptures   RHINOPLASTY     Social History   Socioeconomic History   Marital status: Married    Spouse name: Not on file   Number of children: Not on file   Years of education: Not on file   Highest education level: Not on file  Occupational History   Not on file  Tobacco Use   Smoking status: Never   Smokeless tobacco: Never  Vaping Use   Vaping status: Never Used  Substance and Sexual Activity   Alcohol use: Yes    Comment: 1-2 glass wine per wk   Drug use: Never   Sexual activity: Not Currently    Partners: Male  Other Topics Concern   Not on file  Social History Narrative   Lives with husband      One story home      Right handed      Highest level of edu- bachelors      Works part time   Social Drivers of Health   Financial Resource Strain: Patient Declined (04/13/2022)   Received from [facility]   Overall Financial Resource Strain (CARDIA)    Difficulty of Paying Living Expenses: Patient declined  Food Insecurity: Patient Declined (11/14/2023)   Received from [facility]   Hunger Vital Sign    Within the past 12 months,  you worried that your food would run out before you got the money to buy more.: Patient declined    Within the past 12 months, the food you bought just didn't last and you didn't have money to get more.: Patient declined  Transportation Needs: No Transportation Needs (11/14/2023)   Received from [facility] - Transportation    Lack of Transportation (Medical): No    Lack of Transportation (Non-Medical): No  Physical Activity: Patient Declined (11/14/2023)   Received from [facility]   Exercise Vital Sign    Days of Exercise per Week: Patient declined    Minutes of Exercise per Session: Patient declined  Stress: No Stress Concern Present (03/07/2021)   Received from [facility] of Occupational Health - Occupational Stress Questionnaire    Feeling of Stress : Not at all  Social Connections: Moderately Integrated (03/07/2021)   Received from [facility]   Social Connection and Isolation Panel    Frequency of Communication with Friends and Family: Twice a week    Frequency of Social Gatherings with Friends and Family: Once a week    Attends Religious Services: Never    Database administrator or Organizations: Yes  Attends Engineer, structural: More than 4 times per year    Marital Status: Married   Allergies  Allergen Reactions   Amlodipine     Dizzy, palpitations   Fentanyl  Other (See Comments)   Other Other (See Comments)   Penicillins Other (See Comments)    Has patient had a PCN reaction causing immediate rash, facial/tongue/throat swelling, SOB or lightheadedness with hypotension: Unknown Has patient had a PCN reaction causing severe rash involving mucus membranes or skin necrosis: No Has patient had a PCN reaction that required hospitalization: No Has patient had a PCN reaction occurring within the last 10 years: No If all of the above answers are NO, then may proceed with Cephalosporin use.    Albuterol Palpitations   Sulfa Antibiotics  Rash   Family History  Problem Relation Age of Onset   Parkinson's disease Mother    Hypertension Father      Current Outpatient Medications (Cardiovascular):    benazepril  (LOTENSIN ) 40 MG tablet, TAKE 1 TABLET BY MOUTH EVERY DAY. APPOINTMENT DUE IN [place]  Current Outpatient Medications (Respiratory):    benzonatate  (TESSALON ) 200 MG capsule, Take 1 capsule (200 mg total) by mouth 2 (two) times daily as needed for cough.  Current Outpatient Medications (Analgesics):    meloxicam  (MOBIC ) 15 MG tablet, Take 1 tablet (15 mg total) by mouth daily.   Current Outpatient Medications (Other):    cholecalciferol (VITAMIN D3) 25 MCG (1000 UT) tablet, Take 1,000 Units by mouth daily.   hydrocortisone  2.5 % ointment, Apply topically 2 (two) times daily.   Reviewed prior external information including notes and imaging from  primary care provider As well as notes that were available from care everywhere and other healthcare systems.  Past medical history, social, surgical and family history all reviewed in electronic medical record.  No pertanent information unless stated regarding to the chief complaint.   Review of Systems:  No headache, visual changes, nausea, vomiting, diarrhea, constipation, dizziness, abdominal pain, skin rash, fevers, chills, night sweats, weight loss, swollen lymph nodes, body aches, joint swelling, chest pain, shortness of breath, mood changes. POSITIVE muscle aches  Objective  Blood pressure (!) 152/102, pulse 80, height 5' 4 (1.626 m), weight 125 lb (56.7 kg), SpO2 98%.   General: No apparent distress alert and oriented x3 mood and affect normal, dressed appropriately.  HEENT: Pupils equal, extraocular movements intact  Respiratory: Patient's speak in full sentences and does not appear short of breath  Cardiovascular: No lower extremity edema, non tender, no erythema  Shoulder exam shows improvement in range of motion noted.  Patient has near full strength with  some very mild weakness noted with internal rotation with the subscapularis.  Limited muscular skeletal ultrasound was performed and interpreted by CLAUDENE HUSSAR, M  Limited ultrasound shows significant improvement in health of the subscapularis as well as supraspinatus.  With tears were appreciated previously seems to be completely or near completely healed at the moment.  Under dynamic testing patient does have a no gapping. Impression: Interval improvement    Impression and Recommendations:    The above documentation has been reviewed and is accurate and complete Cassandra Garner M Cassandra Corral, DO

## 2024-02-22 ENCOUNTER — Ambulatory Visit (INDEPENDENT_AMBULATORY_CARE_PROVIDER_SITE_OTHER)

## 2024-02-22 ENCOUNTER — Encounter: Payer: Self-pay | Admitting: Family Medicine

## 2024-02-22 ENCOUNTER — Ambulatory Visit: Admitting: Family Medicine

## 2024-02-22 ENCOUNTER — Ambulatory Visit: Payer: Self-pay | Admitting: Family Medicine

## 2024-02-22 ENCOUNTER — Other Ambulatory Visit: Payer: Self-pay

## 2024-02-22 VITALS — BP 152/102 | HR 80 | Ht 64.0 in | Wt 125.0 lb

## 2024-02-22 DIAGNOSIS — G8929 Other chronic pain: Secondary | ICD-10-CM

## 2024-02-22 DIAGNOSIS — M75112 Incomplete rotator cuff tear or rupture of left shoulder, not specified as traumatic: Secondary | ICD-10-CM

## 2024-02-22 DIAGNOSIS — M79631 Pain in right forearm: Secondary | ICD-10-CM | POA: Diagnosis not present

## 2024-02-22 DIAGNOSIS — M25512 Pain in left shoulder: Secondary | ICD-10-CM

## 2024-02-22 NOTE — Patient Instructions (Addendum)
 Xray today Start band work 3x a week with a rest day in between 3600mg  tart cherry at night See you again in 3 months

## 2024-02-22 NOTE — Assessment & Plan Note (Signed)
 Has done remarkably well at this time.  Expect patient to continue to make improvement.  Will continue to increase activity.  Patient can do more lifting at this moment but go slow.  Patient is progressing much further than the last time.  Expect patient to continue to do well.  Follow-up with me again in 3 months to make sure completely resolved

## 2024-02-29 ENCOUNTER — Ambulatory Visit (INDEPENDENT_AMBULATORY_CARE_PROVIDER_SITE_OTHER): Admitting: Dermatology

## 2024-02-29 ENCOUNTER — Encounter: Payer: Self-pay | Admitting: Dermatology

## 2024-02-29 VITALS — BP 133/85 | HR 79

## 2024-02-29 DIAGNOSIS — Z7189 Other specified counseling: Secondary | ICD-10-CM | POA: Diagnosis not present

## 2024-02-29 DIAGNOSIS — R234 Changes in skin texture: Secondary | ICD-10-CM | POA: Diagnosis not present

## 2024-02-29 DIAGNOSIS — K130 Diseases of lips: Secondary | ICD-10-CM | POA: Diagnosis not present

## 2024-02-29 DIAGNOSIS — R238 Other skin changes: Secondary | ICD-10-CM

## 2024-02-29 DIAGNOSIS — L578 Other skin changes due to chronic exposure to nonionizing radiation: Secondary | ICD-10-CM

## 2024-02-29 NOTE — Patient Instructions (Addendum)
 Date: Wed Feb 29 2024  Hello Cassandra Garner,  Thank you for visiting today. Here is a summary of the key instructions:  - Morning Skin Care:   - Wash face with warm water or CeraVe hydrating facial cleanser   - Apply Vichy hyaluronic acid serum to damp skin   - Use Avene Tolerance moisturizer   - Apply sunscreen last  - Evening Skin Care:   - Wash face   - Apply Vichy hyaluronic acid serum   - Apply Avene Cicalfate balm  - Treatment Instructions:   - Use hydrocortisone  cream for angular cheilitis (inflammation in corners of mouth)   - Apply CeraVe healing ointment daily to corners of mouth as prevention  - Sunscreen Guidelines:   - Apply sunscreen after moisturizer   - No need to wash off nighttime moisturizer before applying sunscreen, unless using ointment  - Keep appointment on October 22nd for full body skin cancer screening   - Discuss starting Efudex (5-Fluorouracil) cream treatment in November  Please reach out if you have any questions or concerns before your next visit.  Warm regards,  Dr. [facility]    Skin Education :   I counseled the patient regarding the following: Sun screen (SPF 30 or greater) should be applied during peak UV exposure (between 10am and 2pm) and reapplied after exercise or swimming.  The ABCDEs of melanoma were reviewed with the patient, and the importance of monthly self-examination of moles was emphasized. Should any moles change in shape or color, or itch, bleed or burn, pt will contact our office for evaluation sooner then their interval appointment.  Plan: Sunscreen Recommendations I recommended a broad spectrum sunscreen with a SPF of 30 or higher. I explained that SPF 30 sunscreens block approximately 97 percent of the sun's harmful rays. Sunscreens should be applied at least 15 minutes prior to expected sun exposure and then every 2 hours after that as long as sun exposure continues. If swimming or exercising sunscreen  should be reapplied every 45 minutes to an hour after getting wet or sweating. One ounce, or the equivalent of a shot glass full of sunscreen, is adequate to protect the skin not covered by a bathing suit. I also recommended a lip balm with a sunscreen as well. Sun protective clothing can be used in lieu of sunscreen but must be worn the entire time you are exposed to the sun's rays.  Important Information  Due to recent changes in healthcare laws, you may see results of your pathology and/or laboratory studies on MyChart before the doctors have had a chance to review them. We understand that in some cases there may be results that are confusing or concerning to you. Please understand that not all results are received at the same time and often the doctors may need to interpret multiple results in order to provide you with the best plan of care or course of treatment. Therefore, we ask that you please give us  2 business days to thoroughly review all your results before contacting the office for clarification. Should we see a critical lab result, you will be contacted sooner.   If You Need Anything After Your Visit  If you have any questions or concerns for your doctor, please call our main line at [phone] If no one answers, please leave a voicemail as directed and we will return your call as soon as possible. Messages left after 4 pm will be answered the following business day.   You may  also send us  a message via MyChart. We typically respond to MyChart messages within 1-2 business days.  For prescription refills, please ask your pharmacy to contact our office. Our fax number is [phone].  If you have an urgent issue when the clinic is closed that cannot wait until the next business day, you can page your doctor at the number below.    Please note that while we do our best to be available for urgent issues outside of office hours, we are not available 24/7.   If you have an urgent issue  and are unable to reach us , you may choose to seek medical care at your doctor's office, retail clinic, urgent care center, or emergency room.  If you have a medical emergency, please immediately call 911 or go to the emergency department. In the event of inclement weather, please call our main line at [phone] for an update on the status of any delays or closures.  Dermatology Medication Tips: Please keep the boxes that topical medications come in in order to help keep track of the instructions about where and how to use these. Pharmacies typically print the medication instructions only on the boxes and not directly on the medication tubes.   If your medication is too expensive, please contact our office at [phone] or send us  a message through MyChart.   We are unable to tell what your co-pay for medications will be in advance as this is different depending on your insurance coverage. However, we may be able to find a substitute medication at lower cost or fill out paperwork to get insurance to cover a needed medication.   If a prior authorization is required to get your medication covered by your insurance company, please allow us  1-2 business days to complete this process.  Drug prices often vary depending on where the prescription is filled and some pharmacies may offer cheaper prices.  The website www.goodrx.com contains coupons for medications through different pharmacies. The prices here do not account for what the cost may be with help from insurance (it may be cheaper with your insurance), but the website can give you the price if you did not use any insurance.  - You can print the associated coupon and take it with your prescription to the pharmacy.  - You may also stop by our office during regular business hours and pick up a GoodRx coupon card.  - If you need your prescription sent electronically to a different pharmacy, notify our office through [facility] or by  phone at [phone]

## 2024-02-29 NOTE — Progress Notes (Signed)
 Follow-Up Visit   Subjective  Cassandra Garner is a 68 y.o. female who presents for the following: Patient here to follow up after treatment for contact dermatitis.  Ms. Cassandra, Garner presents for follow-up of previous skin irritation and routine dermatological care. She reports that the irritation from tretinoin, which was identified as a trigger during her last visit in April, has resolved with the use of Healing Ointment and hydrocortisone  cream around her nose as prescribed.  The patient mentions experiencing angular cheilitis, describing it as looking like the corners of her mouth might split. She has been using CeraVe healing ointment as a preventative measure and moisturizer. Ms. Cassandra Garner also reports rough skin on her legs, which she attributes to significant sun exposure. She attempted to treat this with olive oil, but found it ineffective.  Ms. Cassandra Garner inquires about various skincare products, including CeraVe moisturizing cream for her body and emulsion for her face and knees. She expresses satisfaction with her current skincare regimen, noting that it has been helping her significantly.  The following portions of the chart were reviewed this encounter and updated as appropriate: medications, allergies, medical history  Review of Systems:  No other skin or systemic complaints except as noted in HPI or Assessment and Plan.  Objective  Well appearing patient in no apparent distress; mood and affect are within normal limits.   A focused examination was performed of the following areas: face  Relevant exam findings are noted in the Assessment and Plan.    Assessment & Plan    1. 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 The condition is currently not active but has a tendency to recur. Angular cheilitis is noted to be more common  with aging due to saliva pooling in the corners of the mouth, causing skin irritation.  - Plan:    Continue using CeraVe Healing Ointment daily as a preventative barrier    Use hydrocortisone  cream as needed for acute flares    Monitor for recurrence and report if symptoms worsen  2. Rough skin on legs with sun exposure - Assessment: Patient reports rough skin on legs with a history of significant sun exposure. Previous use of olive oil as a moisturizer exacerbated the condition. Current use of CeraVe moisturizing cream for the body is appropriate.  - Plan:    Continue using CeraVe moisturizing cream for body    Avoid use of pure oils or occlusive barriers like Vaseline on legs    Educate on proper sun protection measures  3. Skin care regimen optimization - Assessment: Patient is using various skincare products and seeking guidance on optimizing her regimen. Current products include CeraVe moisturizing cream for body, CeraVe emulsion for face, and St. Costella Rick Scrub for face washing. Patient expresses interest in incorporating hyaluronic acid and is considering Avne products.  - Plan:    Morning routine:     - Gentle cleanse with warm water or CeraVe hydrating facial cleanser     - Apply Vichy hyaluronic acid serum to damp skin     - Follow with Avne Tolerance moisturizer     - Apply sunscreen as final step    Evening routine:     - Cleanse face (can use Apricot Scrub every 2-3 days with gentle pressure)     - Apply Vichy hyaluronic acid serum     - Apply Avne Cicalfate balm as  nighttime moisturizer    Provide samples of Avne products for trial    Educate on proper application of products and importance of sun protection  4. Actinic Damage - Assessment: Patient is interested in preventive dermatological treatments, including 5-Fluorouracil (Efudex) cream for potential precancerous lesions. A full body skin cancer screening is due.  - Plan:    Scheduled full body skin  cancer screening for October 22nd, 2025    Discuss and potentially prescribe 5-Fluorouracil cream at the October appointment    Educate patient on proper use of 5-Fluorouracil cream and expected skin reactions    Advise to start treatment in early November, considering patient's schedule and preferences    Instruct patient to note any concerning skin changes before the appointment   No follow-ups on file.  Cassandra Garner, Surg Tech III, am acting as scribe for [employer], DO.   Documentation: I have reviewed the above documentation for accuracy and completeness, and I agree with the above.  Cassandra Lenis, DO

## 2024-04-06 ENCOUNTER — Ambulatory Visit: Payer: Self-pay

## 2024-04-06 NOTE — Telephone Encounter (Signed)
 FYI Only or Action Required?: FYI only for provider.  Patient was last seen in primary care on 12/24/2023 by Cassandra Garner ORN, NP.  Called Nurse Triage reporting Finger Injury.  Symptoms began today.  Interventions attempted: Nothing.  Symptoms are: stable.  Triage Disposition: Home Care  Patient/caregiver understands and will follow disposition?: YesCopied from CRM #[id number]. Topic: Clinical - Red Word Triage >> Apr 06, 2024 11:44 AM [name] wrote: [facility] that prompted transfer to Nurse Triage: Patient had a deep wound from a sharp metal in yard on left finger, concerned Reason for Disposition  Small cut (scratch) or abrasion (scrape) is also present  Answer Assessment - Initial Assessment Questions Pt refused triage. Pt asked when last tetanus shot was. Shot was on 06/2018. Pt stated the cut isn't deep. I just needed to make sure I was up to date. I'll call back if this worsens.  Protocols used: Finger Injury-A-AH

## 2024-04-10 ENCOUNTER — Ambulatory Visit: Payer: Medicare Other | Admitting: Dermatology

## 2024-04-30 ENCOUNTER — Ambulatory Visit (INDEPENDENT_AMBULATORY_CARE_PROVIDER_SITE_OTHER): Payer: Medicare Other

## 2024-04-30 VITALS — BP 122/76 | HR 76 | Ht 64.0 in | Wt 125.0 lb

## 2024-04-30 DIAGNOSIS — Z0000 Encounter for general adult medical examination without abnormal findings: Secondary | ICD-10-CM

## 2024-04-30 NOTE — Progress Notes (Signed)
 Subjective:   Cassandra Garner is a 68 y.o. who presents for a Medicare Wellness preventive visit.  As a reminder, Annual Wellness Visits don't include a physical exam, and some assessments may be limited, especially if this visit is performed virtually. We may recommend an in-person follow-up visit with your provider if needed.  Visit Complete: In person  Persons Participating in Visit: Patient.  AWV Questionnaire: No: Patient Medicare AWV questionnaire was not completed prior to this visit.  Cardiac Risk Factors include: advanced age (>31men, >55 women);hypertension     Objective:    Today's Vitals   04/30/24 0950  BP: 122/76  Pulse: 76  SpO2: 96%  Weight: 125 lb (56.7 kg)  Height: 5' 4 (1.626 m)   Body mass index is 21.46 kg/m.     04/30/2024   10:11 AM 02/03/2019    1:00 PM 07/19/2017    5:17 PM  Advanced Directives  Does Patient Have a Medical Advance Directive? Yes No No   Type of Estate agent of [place];Living will    Copy of Healthcare Power of Attorney in Chart? No - copy requested    Would patient like information on creating a medical advance directive?   No - Patient declined      Data saved with a previous flowsheet row definition    Current Medications (verified) Outpatient Encounter Medications as of 04/30/2024  Medication Sig   benazepril  (LOTENSIN ) 40 MG tablet TAKE 1 TABLET BY MOUTH EVERY DAY. APPOINTMENT DUE IN JUNE   cholecalciferol (VITAMIN D3) 25 MCG (1000 UT) tablet Take 1,000 Units by mouth daily.   hydrocortisone  2.5 % ointment Apply topically 2 (two) times daily.   benzonatate  (TESSALON ) 200 MG capsule Take 1 capsule (200 mg total) by mouth 2 (two) times daily as needed for cough. (Patient not taking: Reported on 04/30/2024)   meloxicam  (MOBIC ) 15 MG tablet Take 1 tablet (15 mg total) by mouth daily. (Patient not taking: Reported on 04/30/2024)   No facility-administered encounter medications on file as of 04/30/2024.     Allergies (verified) Amlodipine, Fentanyl , Other, Penicillins, Albuterol, and Sulfa antibiotics   History: Past Medical History:  Diagnosis Date   Diverticulitis 01/10/2018   Hypertension    PTSD (post-traumatic stress disorder)    Past Surgical History:  Procedure Laterality Date   PLACEMENT OF BREAST IMPLANTS     known ruptures   RHINOPLASTY     Family History  Problem Relation Age of Onset   Parkinson's disease Mother    Hypertension Father    Social History   Socioeconomic History   Marital status: Married    Spouse name: Not on file   Number of children: Not on file   Years of education: Not on file   Highest education level: Not on file  Occupational History   Not on file  Tobacco Use   Smoking status: Never   Smokeless tobacco: Never  Vaping Use   Vaping status: Never Used  Substance and Sexual Activity   Alcohol use: Yes    Alcohol/week: 1.0 standard drink of alcohol    Types: 1 Glasses of wine per week    Comment: 1-2 glass wine per wk   Drug use: Never   Sexual activity: Not Currently    Partners: Male  Other Topics Concern   Not on file  Social History Narrative   Lives with husband      One story home      Right handed  Highest level of edu- bachelors      Works part time   Social Drivers of Corporate investment banker Strain: Low Risk  (04/30/2024)   Overall Financial Resource Strain (CARDIA)    Difficulty of Paying Living Expenses: Not hard at all  Food Insecurity: No Food Insecurity (04/30/2024)   Hunger Vital Sign    Worried About Running Out of Food in the Last Year: Never true    Ran Out of Food in the Last Year: Never true  Transportation Needs: No Transportation Needs (04/30/2024)   PRAPARE - Administrator, Civil Service (Medical): No    Lack of Transportation (Non-Medical): No  Physical Activity: Insufficiently Active (04/30/2024)   Exercise Vital Sign    Days of Exercise per Week: 3 days    Minutes of Exercise  per Session: 30 min  Stress: No Stress Concern Present (04/30/2024)   [employer] of Occupational Health - Occupational Stress Questionnaire    Feeling of Stress: Only a little  Social Connections: Moderately Integrated (04/30/2024)   Social Connection and Isolation Panel    Frequency of Communication with Friends and Family: More than three times a week    Frequency of Social Gatherings with Friends and Family: More than three times a week    Attends Religious Services: Never    Database administrator or Organizations: Yes    Attends Engineer, structural: More than 4 times per year    Marital Status: Married    Tobacco Counseling Counseling given: Not Answered    Clinical Intake:  Pre-visit preparation completed: Yes  Pain : No/denies pain     BMI - recorded: 21.46 Nutritional Status: BMI of 19-24  Normal Nutritional Risks: None Diabetes: No  Lab Results  Component Value Date   HGBA1C 5.6 08/04/2022   HGBA1C 5.7 07/29/2021     How often do you need to have someone help you when you read instructions, pamphlets, or other written materials from your doctor or pharmacy?: 1 - Never  Interpreter Needed?: No  Information entered by :: Verdie Saba, CMA   Activities of Daily Living pat    04/30/2024    9:53 AM  In your present state of health, do you have any difficulty performing the following activities:  Hearing? 0  Vision? 0  Difficulty concentrating or making decisions? 0  Walking or climbing stairs? 0  Dressing or bathing? 0  Doing errands, shopping? 0  Preparing Food and eating ? N  Using the Toilet? N  In the past six months, have you accidently leaked urine? N  Do you have problems with loss of bowel control? N  Managing your Medications? N  Managing your Finances? N  Housekeeping or managing your Housekeeping? N    Patient Care Team: Norleen Lynwood ORN, MD as PCP - General (Internal Medicine) Patrcia Sharper, MD as Consulting Physician  (Ophthalmology)  I have updated your Care Teams any recent Medical Services you may have received from other providers in the past year.     Assessment:   This is a routine wellness examination for Cassandra Garner.  Hearing/Vision screen Hearing Screening - Comments:: Denies hearing difficulties   Vision Screening - Comments:: Wears rx glasses - up to date with routine eye exams with Dr Patrcia   Goals Addressed               This Visit's Progress     Patient Stated (pt-stated)  Patient stated she plans to monitor her BP readings        Depression Screen     04/30/2024    9:55 AM 08/08/2023    8:37 AM 08/04/2022    9:22 AM 08/04/2022    9:05 AM 07/29/2021    8:50 AM 04/04/2020    3:18 PM 04/04/2020    2:52 PM  PHQ 2/9 Scores  PHQ - 2 Score 0 0 1 0 3 0 0  PHQ- 9 Score 0   1 9      Fall Risk     04/30/2024    9:54 AM 08/08/2023    8:36 AM 08/04/2022    9:22 AM 08/04/2022    9:06 AM 04/04/2020    3:18 PM  Fall Risk   Falls in the past year? 1 1 0 0 0  Number falls in past yr: 0 0 0    Comment 1      Injury with Fall? 1 1 0 0   Comment Nov 2024      Risk for fall due to :  History of fall(s)  No Fall Risks   Follow up Falls evaluation completed;Falls prevention discussed Falls evaluation completed  Falls evaluation completed       Data saved with a previous flowsheet row definition    MEDICARE RISK AT HOME:  Medicare Risk at Home Any stairs in or around the home?: Yes If so, are there any without handrails?: No Home free of loose throw rugs in walkways, pet beds, electrical cords, etc?: Yes Adequate lighting in your home to reduce risk of falls?: Yes Life alert?: No Use of a cane, walker or w/c?: No Grab bars in the bathroom?: No Shower chair or bench in shower?: No Elevated toilet seat or a handicapped toilet?: No  TIMED UP AND GO:  Was the test performed?  No  Cognitive Function: 6CIT completed        04/30/2024    9:57 AM  6CIT Screen  What Year? 0  points  What month? 0 points  What time? 0 points  Count back from 20 0 points  Months in reverse 0 points  Repeat phrase 0 points  Total Score 0 points    Immunizations Immunization History  Administered Date(s) Administered   Tdap 04/19/2008, 07/10/2018    Screening Tests Health Maintenance  Topic Date Due   COVID-19 Vaccine (1) Never done   Zoster Vaccines- Shingrix (1 of 2) Never done   Pneumococcal Vaccine: 50+ Years (1 of 1 - PCV) Never done   Influenza Vaccine  Never done   Medicare Annual Wellness (AWV)  04/30/2025   MAMMOGRAM  05/08/2025   DTaP/Tdap/Td (3 - Td or Tdap) 07/10/2028   Colonoscopy  05/01/2033   DEXA SCAN  Completed   Hepatitis C Screening  Completed   HPV VACCINES  Aged Out   Meningococcal B Vaccine  Aged Out    Health Maintenance Items Addressed:  I have recommended that this patient have a flu shot and immunization for Pneumonia but she declines at this time. I have discussed the risks and benefits of this procedure with her. The patient verbalizes understanding.   Additional Screening:  Vision Screening: Recommended annual ophthalmology exams for early detection of glaucoma and other disorders of the eye. Is the patient up to date with their annual eye exam?  Yes  Who is the provider or what is the name of the office in which the patient attends annual eye exams?  Dr Patrcia   Dental Screening: Recommended annual dental exams for proper oral hygiene  Community Resource Referral / Chronic Care Management: CRR required this visit?  No   CCM required this visit?  No   Plan:    I have personally reviewed and noted the following in the patient's chart:   Medical and social history Use of alcohol, tobacco or illicit drugs  Current medications and supplements including opioid prescriptions. Patient is not currently taking opioid prescriptions. Functional ability and status Nutritional status Physical activity Advanced directives List of  other physicians Hospitalizations, surgeries, and ER visits in previous 12 months Vitals Screenings to include cognitive, depression, and falls Referrals and appointments  In addition, I have reviewed and discussed with patient certain preventive protocols, quality metrics, and best practice recommendations. A written personalized care plan for preventive services as well as general preventive health recommendations were provided to patient.   Verdie CHRISTELLA Saba, CMA   04/30/2024   After Visit Summary: (In Person-Declined) Patient declined AVS at this time.  Notes: Nothing significant to report at this time.

## 2024-04-30 NOTE — Patient Instructions (Addendum)
 Ms. Yellin,  Thank you for taking the time for your Medicare Wellness Visit. I appreciate your continued commitment to your health goals. Please review the care plan we discussed, and feel free to reach out if I can assist you further.  Medicare recommends these wellness visits once per year to help you and your care team stay ahead of potential health issues. These visits are designed to focus on prevention, allowing your provider to concentrate on managing your acute and chronic conditions during your regular appointments.  Please note that Annual Wellness Visits do not include a physical exam. Some assessments may be limited, especially if the visit was conducted virtually. If needed, we may recommend a separate in-person follow-up with your provider.  Ongoing Care Seeing your primary care provider every 3 to 6 months helps us  monitor your health and provide consistent, personalized care.   Referrals If a referral was made during today's visit and you haven't received any updates within two weeks, please contact the referred provider directly to check on the status.  Recommended Screenings:  Health Maintenance  Topic Date Due   COVID-19 Vaccine (1) Never done   Zoster (Shingles) Vaccine (1 of 2) Never done   Pneumococcal Vaccine for age over 5 (1 of 1 - PCV) Never done   Flu Shot  Never done   Medicare Annual Wellness Visit  04/30/2025   Mammogram  05/08/2025   DTaP/Tdap/Td vaccine (3 - Td or Tdap) 07/10/2028   Colon Cancer Screening  05/01/2033   DEXA scan (bone density measurement)  Completed   Hepatitis C Screening  Completed   HPV Vaccine  Aged Out   Meningitis B Vaccine  Aged Out       02/03/2019    1:00 PM  Advanced Directives  Does Patient Have a Medical Advance Directive? No   Advance Care Planning is important because it: Ensures you receive medical care that aligns with your values, goals, and preferences. Provides guidance to your family and loved ones,  reducing the emotional burden of decision-making during critical moments.  Vision: Annual vision screenings are recommended for early detection of glaucoma, cataracts, and diabetic retinopathy. These exams can also reveal signs of chronic conditions such as diabetes and high blood pressure.  Dental: Annual dental screenings help detect early signs of oral cancer, gum disease, and other conditions linked to overall health, including heart disease and diabetes.

## 2024-05-09 DIAGNOSIS — N644 Mastodynia: Secondary | ICD-10-CM | POA: Insufficient documentation

## 2024-05-23 NOTE — Progress Notes (Unsigned)
 [facility] [address] [place] [address] Phone: [phone] Subjective:   [name] [name], am serving as a scribe for Dr. Arthea Claudene.  I'm seeing this patient by the request  of:  Norleen Lynwood ORN, MD  CC: Left shoulder pain  YEP:Dlagzrupcz  02/22/2024 Has done remarkably well at this time.  Expect patient to continue to make improvement.  Will continue to increase activity.  Patient can do more lifting at this moment but go slow.  Patient is progressing much further than the last time.  Expect patient to continue to do well.  Follow-up with me again in 3 months to make sure completely resolved      Update 05/24/2024 Arsenia Goracke is a 68 y.o. female coming in with complaint of L shoulder pain.  Found to have rotator cuff tear but had been making significant improvement with the PRP.  Last seen nearly 3 months ago.  Patient states that she has pain with ER but overall has been doing well.   Patient's most recent mammogram in September 2025 showed no significant changes since 2023.  Past Medical History:  Diagnosis Date   Diverticulitis 01/10/2018   Hypertension    PTSD (post-traumatic stress disorder)    Past Surgical History:  Procedure Laterality Date   PLACEMENT OF BREAST IMPLANTS     known ruptures   RHINOPLASTY     Social History   Socioeconomic History   Marital status: Married    Spouse name: Not on file   Number of children: Not on file   Years of education: Not on file   Highest education level: Not on file  Occupational History   Not on file  Tobacco Use   Smoking status: Never   Smokeless tobacco: Never  Vaping Use   Vaping status: Never Used  Substance and Sexual Activity   Alcohol use: Yes    Alcohol/week: 1.0 standard drink of alcohol    Types: 1 Glasses of wine per week    Comment: 1-2 glass wine per wk   Drug use: Never   Sexual activity: Not Currently    Partners: Male  Other Topics Concern   Not on  file  Social History Narrative   Lives with husband      One story home      Right handed      Highest level of edu- bachelors      Works part time   Social Drivers of Corporate investment banker Strain: Low Risk  (04/30/2024)   Overall Financial Resource Strain (CARDIA)    Difficulty of Paying Living Expenses: Not hard at all  Food Insecurity: No Food Insecurity (04/30/2024)   Hunger Vital Sign    Worried About Running Out of Food in the Last Year: Never true    Ran Out of Food in the Last Year: Never true  Transportation Needs: No Transportation Needs (04/30/2024)   PRAPARE - Administrator, Civil Service (Medical): No    Lack of Transportation (Non-Medical): No  Physical Activity: Insufficiently Active (04/30/2024)   Exercise Vital Sign    Days of Exercise per Week: 3 days    Minutes of Exercise per Session: 30 min  Stress: No Stress Concern Present (04/30/2024)   [employer] of Occupational Health - Occupational Stress Questionnaire    Feeling of Stress: Only a little  Social Connections: Moderately Integrated (04/30/2024)   Social Connection and Isolation Panel    Frequency of  Communication with Friends and Family: More than three times a week    Frequency of Social Gatherings with Friends and Family: More than three times a week    Attends Religious Services: Never    Database administrator or Organizations: Yes    Attends Engineer, structural: More than 4 times per year    Marital Status: Married   Allergies  Allergen Reactions   Amlodipine     Dizzy, palpitations   Fentanyl  Other (See Comments)   Other Other (See Comments)   Penicillins Other (See Comments)    Has patient had a PCN reaction causing immediate rash, facial/tongue/throat swelling, SOB or lightheadedness with hypotension: Unknown Has patient had a PCN reaction causing severe rash involving mucus membranes or skin necrosis: No Has patient had a PCN reaction that required  hospitalization: No Has patient had a PCN reaction occurring within the last 10 years: No If all of the above answers are NO, then may proceed with Cephalosporin use.    Albuterol Palpitations   Sulfa Antibiotics Rash   Family History  Problem Relation Age of Onset   Parkinson's disease Mother    Hypertension Father      Current Outpatient Medications (Cardiovascular):    benazepril  (LOTENSIN ) 40 MG tablet, TAKE 1 TABLET BY MOUTH EVERY DAY. APPOINTMENT DUE IN [place]  Current Outpatient Medications (Respiratory):    benzonatate  (TESSALON ) 200 MG capsule, Take 1 capsule (200 mg total) by mouth 2 (two) times daily as needed for cough.  Current Outpatient Medications (Analgesics):    meloxicam  (MOBIC ) 15 MG tablet, Take 1 tablet (15 mg total) by mouth daily.   Current Outpatient Medications (Other):    cholecalciferol (VITAMIN D3) 25 MCG (1000 UT) tablet, Take 1,000 Units by mouth daily.   hydrocortisone  2.5 % ointment, Apply topically 2 (two) times daily.   Reviewed prior external information including notes and imaging from  primary care provider As well as notes that were available from care everywhere and other healthcare systems.  Past medical history, social, surgical and family history all reviewed in electronic medical record.  No pertanent information unless stated regarding to the chief complaint.   Review of Systems:  No headache, visual changes, nausea, vomiting, diarrhea, constipation, dizziness, abdominal pain, skin rash, fevers, chills, night sweats, weight loss, swollen lymph nodes, body aches, joint swelling, chest pain, shortness of breath, mood changes. POSITIVE muscle aches  Objective  Blood pressure (!) 138/94, pulse 65, height 5' 4 (1.626 m), SpO2 97%.   General: No apparent distress alert and oriented x3 mood and affect normal, dressed appropriately.  HEENT: Pupils equal, extraocular movements intact  Respiratory: Patient's speak in full sentences and  does not appear short of breath  Cardiovascular: No lower extremity edema, non tender, no erythema  Left shoulder exam shows significant improvement in range of motion.  Positive speed and Jurgenson negative empty can.  Full range of motion compared to the contralateral side.  Limited muscular skeletal ultrasound was performed and interpreted by CLAUDENE HUSSAR, M  Biceps tendon does have hypoechoic changes noted.  Seems to be more proximal aspect.  No true tearing appreciated at this time.  Patient's previous supraspinatus and subscapularis tears show significant improvement noted. Impression: Interval improvement rotator cuff with residual bicep tendinitis    Impression and Recommendations:     The above documentation has been reviewed and is accurate and complete Arley Salamone M Khing Belcher, DO

## 2024-05-24 ENCOUNTER — Ambulatory Visit (INDEPENDENT_AMBULATORY_CARE_PROVIDER_SITE_OTHER): Admitting: Family Medicine

## 2024-05-24 ENCOUNTER — Other Ambulatory Visit: Payer: Self-pay

## 2024-05-24 ENCOUNTER — Encounter: Payer: Self-pay | Admitting: Family Medicine

## 2024-05-24 VITALS — BP 138/94 | HR 65 | Ht 64.0 in

## 2024-05-24 DIAGNOSIS — M7522 Bicipital tendinitis, left shoulder: Secondary | ICD-10-CM | POA: Diagnosis not present

## 2024-05-24 DIAGNOSIS — M75112 Incomplete rotator cuff tear or rupture of left shoulder, not specified as traumatic: Secondary | ICD-10-CM | POA: Diagnosis not present

## 2024-05-24 DIAGNOSIS — M25512 Pain in left shoulder: Secondary | ICD-10-CM

## 2024-05-24 NOTE — Assessment & Plan Note (Signed)
 Questionable reactive bicep tendinitis.  Discussed potential injection with patient declined.  I do not feel that there is any tearing of the biceps or no PRP would be beneficial.  Discussed counterforce bracing icing and massage follow-up in 16 weeks
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
## 2024-05-24 NOTE — Patient Instructions (Addendum)
 Tennis elbow strap Roll out bicep Ok to do any activity I think you can see me when you need me

## 2024-05-31 ENCOUNTER — Ambulatory Visit (HOSPITAL_COMMUNITY)
Admission: EM | Admit: 2024-05-31 | Discharge: 2024-05-31 | Disposition: A | Discharge status: Home / Self Care | Attending: Emergency Medicine | Admitting: Emergency Medicine

## 2024-05-31 ENCOUNTER — Encounter (HOSPITAL_COMMUNITY): Payer: Self-pay

## 2024-05-31 ENCOUNTER — Ambulatory Visit (INDEPENDENT_AMBULATORY_CARE_PROVIDER_SITE_OTHER)

## 2024-05-31 DIAGNOSIS — S3992XA Unspecified injury of lower back, initial encounter: Secondary | ICD-10-CM
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
## 2024-05-31 NOTE — Discharge Instructions (Signed)
 Your x-ray is negative for any fracture to your coccyx or sacrum. You can alternate between 500 to 1000 mg of Tylenol  and 400 to 600 mg of ibuprofen every 6-8 hours as needed for pain. Alternate between ice and heat as needed for pain. Follow-up with your primary care provider or return here as needed.

## 2024-05-31 NOTE — ED Triage Notes (Signed)
 Patient was riding her horse today bare back. States the horse reared and she came back down hard on her butt on the back of the horse. She did not fall off.   Pain in the tail bone.

## 2024-06-13 ENCOUNTER — Ambulatory Visit: Admitting: Dermatology

## 2024-06-13 ENCOUNTER — Encounter: Payer: Self-pay | Admitting: Dermatology

## 2024-06-13 VITALS — BP 164/87 | HR 77

## 2024-06-13 DIAGNOSIS — C44311 Basal cell carcinoma of skin of nose: Secondary | ICD-10-CM | POA: Diagnosis not present

## 2024-06-13 DIAGNOSIS — L578 Other skin changes due to chronic exposure to nonionizing radiation: Secondary | ICD-10-CM | POA: Diagnosis not present

## 2024-06-13 DIAGNOSIS — Z1283 Encounter for screening for malignant neoplasm of skin: Secondary | ICD-10-CM | POA: Diagnosis not present

## 2024-06-13 DIAGNOSIS — D489 Neoplasm of uncertain behavior, unspecified: Secondary | ICD-10-CM

## 2024-06-13 DIAGNOSIS — L570 Actinic keratosis: Secondary | ICD-10-CM

## 2024-06-13 DIAGNOSIS — L821 Other seborrheic keratosis: Secondary | ICD-10-CM

## 2024-06-13 DIAGNOSIS — L814 Other melanin hyperpigmentation: Secondary | ICD-10-CM

## 2024-06-13 DIAGNOSIS — D229 Melanocytic nevi, unspecified: Secondary | ICD-10-CM

## 2024-06-13 DIAGNOSIS — D1801 Hemangioma of skin and subcutaneous tissue: Secondary | ICD-10-CM

## 2024-06-13 DIAGNOSIS — W908XXA Exposure to other nonionizing radiation, initial encounter: Secondary | ICD-10-CM

## 2024-06-13 MED ORDER — FLUOROURACIL 5 % EX CREA: TOPICAL_CREAM | Freq: Two times a day (BID) | CUTANEOUS | 0 refills | Status: DC

## 2024-06-13 MED ORDER — HYDROCORTISONE 2.5 % EX OINT: TOPICAL_OINTMENT | Freq: Two times a day (BID) | CUTANEOUS | 3 refills | Status: DC

## 2024-06-13 NOTE — Patient Instructions (Addendum)
 VISIT SUMMARY:  Today, you were seen for a dermatological evaluation of a suspicious skin lesion on the right side of your face. You have a history of sun damage and are diligent about sun protection. We discussed the lesion and other areas of concern, and a plan was made for further evaluation and treatment.  YOUR PLAN:  -SUSPECTED BASAL CELL CARCINOMA OF RIGHT NASAL SIDEWALL:  Basal cell carcinoma is a type of skin cancer that does not spread to other parts of the body but can grow larger if not treated. We will perform a shave biopsy of the lesion to confirm the diagnosis. If it is basal cell carcinoma, you will be referred to Dr. Corey for Mohs surgery, which involves removing the cancerous tissue layer by layer and examining it until no cancer cells remain. Please bring reading material to the surgery appointment as the tissue processing takes about an hour.  -ACTINIC KERATOSIS:  Actinic keratosis is a common precancerous skin condition caused by sun exposure.  If your biopsy results are negative for basal cell carcinoma, you will be prescribed fluorouracil for a two-week treatment. This treatment will cause initial redness followed by bright red skin, and you will be given a healing cream to use afterward. Avoid sun exposure during this treatment by using a hat and sunscreen.  You will also be prescribed hydrocortisone  2.5% ointment for post-treatment healing. If your skin becomes excessively sore, stop using fluorouracil early and switch to the healing ointment. You may use makeup during the treatment, but be cautious to avoid bacterial contamination.  INSTRUCTIONS:  We will follow up with you once the biopsy results are available. If the biopsy confirms basal cell carcinoma, you will be referred to Dr. Montel for Mohs surgery. If the results are negative, you will start the fluorouracil treatment after the biopsy site has healed, approximately one week later.    Patient Handout: Wound  Care for Skin Biopsy Site  Taking Care of Your Skin Biopsy Site  Proper care of the biopsy site is essential for promoting healing and minimizing scarring. This handout provides instructions on how to care for your biopsy site to ensure optimal recovery.  1. Cleaning the Wound:  Clean the biopsy site daily with gentle soap and water. Gently pat the area dry with a clean, soft towel. Avoid harsh scrubbing or rubbing the area, as this can irritate the skin and delay healing.  2. Applying Aquaphor and Bandage:  After cleaning the wound, apply a thin layer of Aquaphor ointment to the biopsy site. Cover the area with a sterile bandage to protect it from dirt, bacteria, and friction. Change the bandage daily or as needed if it becomes soiled or wet.  3. Continued Care for One Week:  Repeat the cleaning, Aquaphor application, and bandaging process daily for one week following the biopsy procedure. Keeping the wound clean and moist during this initial healing period will help prevent infection and promote optimal healing.  4. Massaging Aquaphor into the Area:  ---After one week, discontinue the use of bandages but continue to apply Aquaphor to the biopsy site. ----Gently massage the Aquaphor into the area using circular motions. ---Massaging the skin helps to promote circulation and prevent the formation of scar tissue.   Additional Tips:  Avoid exposing the biopsy site to direct sunlight during the healing process, as this can cause hyperpigmentation or worsen scarring. If you experience any signs of infection, such as increased redness, swelling, warmth, or drainage from the wound, contact  your healthcare provider immediately. Follow any additional instructions provided by your healthcare provider for caring for the biopsy site and managing any discomfort. Conclusion:  Taking proper care of your skin biopsy site is crucial for ensuring optimal healing and minimizing scarring. By  following these instructions for cleaning, applying Aquaphor, and massaging the area, you can promote a smooth and successful recovery. If you have any questions or concerns about caring for your biopsy site, don't hesitate to contact your healthcare provider for guidance.      Important Information  Due to recent changes in healthcare laws, you may see results of your pathology and/or laboratory studies on MyChart before the doctors have had a chance to review them. We understand that in some cases there may be results that are confusing or concerning to you. Please understand that not all results are received at the same time and often the doctors may need to interpret multiple results in order to provide you with the best plan of care or course of treatment. Therefore, we ask that you please give us  2 business days to thoroughly review all your results before contacting the office for clarification. Should we see a critical lab result, you will be contacted sooner.   If You Need Anything After Your Visit  If you have any questions or concerns for your doctor, please call our main line at [phone] If no one answers, please leave a voicemail as directed and we will return your call as soon as possible. Messages left after 4 pm will be answered the following business day.   You may also send us  a message via MyChart. We typically respond to MyChart messages within 1-2 business days.  For prescription refills, please ask your pharmacy to contact our office. Our fax number is [phone].  If you have an urgent issue when the clinic is closed that cannot wait until the next business day, you can page your doctor at the number below.    Please note that while we do our best to be available for urgent issues outside of office hours, we are not available 24/7.   If you have an urgent issue and are unable to reach us , you may choose to seek medical care at your doctor's office, retail clinic,  urgent care center, or emergency room.  If you have a medical emergency, please immediately call 911 or go to the emergency department. In the event of inclement weather, please call our main line at [phone] for an update on the status of any delays or closures.  Dermatology Medication Tips: Please keep the boxes that topical medications come in in order to help keep track of the instructions about where and how to use these. Pharmacies typically print the medication instructions only on the boxes and not directly on the medication tubes.   If your medication is too expensive, please contact our office at [phone] or send us  a message through MyChart.   We are unable to tell what your co-pay for medications will be in advance as this is different depending on your insurance coverage. However, we may be able to find a substitute medication at lower cost or fill out paperwork to get insurance to cover a needed medication.   If a prior authorization is required to get your medication covered by your insurance company, please allow us  1-2 business days to complete this process.  Drug prices often vary depending on where the prescription is filled and some pharmacies may offer cheaper  prices.  The website www.goodrx.com contains coupons for medications through different pharmacies. The prices here do not account for what the cost may be with help from insurance (it may be cheaper with your insurance), but the website can give you the price if you did not use any insurance.  - You can print the associated coupon and take it with your prescription to the pharmacy.  - You may also stop by our office during regular business hours and pick up a GoodRx coupon card.  - If you need your prescription sent electronically to a different pharmacy, notify our office through [facility] or by phone at [phone]

## 2024-06-13 NOTE — Progress Notes (Signed)
 Total Body Skin Exam (TBSE) Visit   Subjective  Cassandra Garner is a 68 y.o. female who presents for the following: Skin Cancer Screening and Full Body Skin Exam  Patient presents today for follow up visit for TBSE. Patient was last evaluated on 02/29/24 Patient denies medication changes. Patient reports she does not have spots, moles and lesions of concern to be evaluated. Patient reports throughout her lifetime she has had moderate sun exposure. Currently, patient reports if she has excessive sun exposure, she does apply sunscreen and/or wears protective coverings.   Patient reports she had a couple of moles removed from her toes as a child that were benign. Patient denies family history of skin cancers.   Rough skin on legs - Recommended to start use of CeraVe hydrating cleanser, Vichy hyaluronic acid and Avene Tolerance moisturizer. Patient reports that she has been using these products and has seen improvement  Actinic Damage - discussion on starting fluorouracil in November  Angular Cheilitis - patient was advised to continue use of CeraVe healing ointment and to use hydrocortisone  as needed for flares  The patient has spots, moles and lesions to be evaluated, some may be new or changing and the patient has concerns that these could be cancer.  The following portions of the chart were reviewed this encounter and updated as appropriate: medications, allergies, medical history  Review of Systems:  No other skin or systemic complaints except as noted in HPI or Assessment and Plan.  Objective  Well appearing patient in no apparent distress; mood and affect are within normal limits.  A full examination was performed including scalp, head, eyes, ears, nose, lips, neck, chest, axillae, abdomen, back, buttocks, bilateral upper extremities, bilateral lower extremities, hands, feet, fingers, toes, fingernails, and toenails. All findings within normal limits unless otherwise noted below.    Relevant physical exam findings are noted in the Assessment and Plan.  Left Nasal Sidewall 3mm pearly papule  Assessment & Plan   LENTIGINES, SEBORRHEIC KERATOSES, HEMANGIOMAS - Benign normal skin lesions - Benign-appearing - Call for any changes  MELANOCYTIC NEVI - Tan-brown and/or pink-flesh-colored symmetric macules and papules - Benign appearing on exam today - Observation - Call clinic for new or changing moles - Recommend daily use of broad spectrum spf 30+ sunscreen to sun-exposed areas.   ACTINIC DAMAGE - Chronic condition, secondary to cumulative UV/sun exposure - diffuse scaly erythematous macules with underlying dyspigmentation - Recommend daily broad spectrum sunscreen SPF 30+ to sun-exposed areas, reapply every 2 hours as needed.  - Staying in the shade or wearing long sleeves, sun glasses (UVA+UVB protection) and wide brim hats (4-inch brim around the entire circumference of the hat) are also recommended for sun protection.  - Call for new or changing lesions.  Prescribed Fluorouracil 5% to start in December. Use for two weeks and then follow up with Hydrocortisone  ointment 2.5% for two weeks    SKIN CANCER SCREENING PERFORMED TODAY.    ACTINIC KERATOSIS   NEOPLASM OF UNCERTAIN BEHAVIOR Left Nasal Sidewall Epidermal / dermal shaving  Lesion diameter (cm):  0.3 Informed consent: discussed and consent obtained   Timeout: patient name, date of birth, surgical site, and procedure verified   Instrument used: DermaBlade   Outcome: patient tolerated procedure well   Post-procedure details: wound care instructions given    Specimen A - Surgical pathology Differential Diagnosis: r/o BCC  Check Margins: No Return in about 1 year (around 06/13/2025) for FBSE F/U.  [name] Lyle Cords, CST III as  acting as a Neurosurgeon for [employer], DO .   Documentation: I have reviewed the above documentation for accuracy and completeness, and I agree with the  above.  Delon Lenis, DO

## 2024-06-15 LAB — SURGICAL PATHOLOGY

## 2024-06-18 ENCOUNTER — Ambulatory Visit: Payer: Self-pay | Admitting: Dermatology

## 2024-06-18 DIAGNOSIS — C4491 Basal cell carcinoma of skin, unspecified: Secondary | ICD-10-CM | POA: Insufficient documentation

## 2024-06-18 NOTE — Telephone Encounter (Signed)
 Pt is returning call for results. Call back number: [phone].

## 2024-06-18 NOTE — Progress Notes (Signed)
 Hi Shirron,  Please call patient and review results and refer for Mohs with Dr Paci for positive skin cancer(s)  Thanks!  Diagnosis Skin , left nasal sidewall BASAL CELL CARCINOMA, NODULAR PATTERN

## 2024-06-25 ENCOUNTER — Encounter: Payer: Self-pay | Admitting: Radiology

## 2024-06-26 ENCOUNTER — Ambulatory Visit: Admitting: Dermatology

## 2024-06-26 ENCOUNTER — Encounter: Payer: Self-pay | Admitting: Dermatology

## 2024-06-26 VITALS — BP 146/72 | HR 85 | Temp 97.7°F

## 2024-06-26 DIAGNOSIS — L578 Other skin changes due to chronic exposure to nonionizing radiation: Secondary | ICD-10-CM | POA: Diagnosis not present

## 2024-06-26 DIAGNOSIS — C44311 Basal cell carcinoma of skin of nose: Secondary | ICD-10-CM

## 2024-06-26 DIAGNOSIS — L814 Other melanin hyperpigmentation: Secondary | ICD-10-CM

## 2024-06-26 DIAGNOSIS — C4491 Basal cell carcinoma of skin, unspecified: Secondary | ICD-10-CM

## 2024-06-26 NOTE — Patient Instructions (Signed)

## 2024-06-26 NOTE — Progress Notes (Signed)
 Follow-Up Visit   Subjective  Cassandra Garner is a 68 y.o. female who presents for the following: Mohs for a nodular basal cell carcinoma on the left nasal sidewall. Patient was seen by Dr. Delon Lenis on 06/13/2024 at which time the lesion was biopsied.   Patient states that it looked a skin tag that was present for a few months. Denies pain or bleeding.   The following portions of the chart were reviewed this encounter and updated as appropriate: medications, allergies, medical history  Review of Systems:  No other skin or systemic complaints except as noted in HPI or Assessment and Plan.  Objective  Well appearing patient in no apparent distress; mood and affect are within normal limits.  A focused examination was performed of the following areas: Left nasal sidewall Relevant physical exam findings are noted in the Assessment and Plan.   Left Nasal sidewall    Assessment & Plan   BASAL CELL CARCINOMA (BCC), UNSPECIFIED SITE Left Nasal sidewall Mohs surgery  Consent obtained: written  Anticoagulation: Was the anticoagulation regimen changed prior to Mohs? No    Anesthesia: Anesthesia method: local infiltration Local anesthetic: lidocaine 1% WITH epi  Procedure Details: Timeout: pre-procedure verification complete Procedure Prep: patient was prepped and draped in usual sterile fashion Prep type: chlorhexidine Biopsy accession number: [id number] Biopsy lab: [facility] Date of biopsy: 06/13/2024 Frozen section biopsy performed: No   Specimen debulked: No   Pre-Op diagnosis: basal cell carcinoma BCC subtype: nodular MohsAIQ Surgical site (if tumor spans multiple areas, please select predominant area): nose Surgery side: left Surgical site (from skin exam): Left Nasal sidewall Pre-operative length (cm): 0.5 Pre-operative width (cm): 0.4 Indications for Mohs surgery: anatomic location where tissue conservation is critical Previously treated? No     Micrographic Surgery Details: Post-operative length (cm): 0.8 Post-operative width (cm): 0.7 Number of Mohs stages: 1 Post surgery depth of defect: subcutaneous fat  Stage 1    Tumor features identified on Mohs section: no tumor identified  Patient tolerance of procedure: tolerated well, no immediate complications  Reconstruction: Was the defect reconstructed? Yes   Was reconstruction performed by the same Mohs surgeon? Yes   Setting of reconstruction: outpatient office When was reconstruction performed? same day Type of reconstruction: linear Linear reconstruction: complex Length of linear repair (cm): 3  Opioids: Did the patient receive a prescription for opioid/narcotic related to Mohs surgery?: No    Antibiotics: Does patient meet AHA guidelines for endocarditis?: No   Does patient meet AHA guidelines for orthopedic prophylaxis?: No   Were antibiotics given on the day of surgery?: No   Did surgery breach mucosa, expose cartilage/bone, involve an area of lymphedema/inflamed/infected tissue? No    Skin repair Complexity:  Complex Final length (cm):  2.9 Informed consent: discussed and consent obtained   Timeout: patient name, date of birth, surgical site, and procedure verified   Procedure prep:  Patient was prepped and draped in usual sterile fashion Prep type:  Chlorhexidine Anesthesia: the lesion was anesthetized in a standard fashion   Anesthetic:  1% lidocaine w/ epinephrine 1-100,000 buffered w/ 8.4% NaHCO3 Reason for type of repair: reduce the risk of dehiscence, infection, and necrosis, reduce subcutaneous dead space and avoid a hematoma, allow closure of the large defect and avoid adjacent structures   Undermining: area extensively undermined   Subcutaneous layers (deep stitches):  Suture size:  5-0 Suture type: Monocryl (poliglecaprone 25)   Stitches:  Buried vertical mattress Fine/surface layer approximation (top stitches):  Suture  size:  6-0 Suture  type: fast-absorbing plain gut   Stitches: simple running   Hemostasis achieved with: suture, pressure and electrodesiccation Outcome: patient tolerated procedure well with no complications   Post-procedure details: sterile dressing applied and wound care instructions given   Dressing type: petrolatum and pressure dressing      Return in about 4 weeks (around 07/24/2024) for Wound Check.  [name] Rollene Gobble, RN, am acting as scribe for RUFUS CHRISTELLA HOLY, MD .   06/26/2024  HISTORY OF PRESENT ILLNESS  Cassandra Garner is seen in consultation at the request of Dr. Alm for biopsy-proven Nodular Basal Cell Carcinoma of the left nasal sidewall. They note that the area has been present for about 6 months increasing in size with time.  There is no history of previous treatment.  Reports no other new or changing lesions and has no other complaints today.  Medications and allergies: see patient chart.  Review of systems: Reviewed 8 systems and notable for the above skin cancer.  All other systems reviewed are unremarkable/negative, unless noted in the HPI. Past medical history, surgical history, family history, social history were also reviewed and are noted in the chart/questionnaire.    PHYSICAL EXAMINATION  General: Well-appearing, in no acute distress, alert and oriented x 4. Vitals reviewed in chart (if available).   Skin: Exam reveals a 0.5 x 0.4 cm erythematous papule and biopsy scar on the left nasal sidewall. There are rhytids, telangiectasias, and lentigines, consistent with photodamage.   Biopsy report(s) reviewed, confirming the diagnosis.   ASSESSMENT  1) Nodular Basal Cell Carcinoma of the left nasal sidewall 2) photodamage 3) solar lentigines   PLAN   1. Due to location, size, histology, or recurrence and the likelihood of subclinical extension as well as the need to conserve normal surrounding tissue, the patient was deemed acceptable for Mohs micrographic surgery  (MMS).  The nature and purpose of the procedure, associated benefits and risks including recurrence and scarring, possible complications such as pain, infection, and bleeding, and alternative methods of treatment if appropriate were discussed with the patient during consent. The lesion location was verified by the patient, by reviewing previous notes, pathology reports, and by photographs as well as angulation measurements if available.  Informed consent was reviewed and signed by the patient, and timeout was performed at 9:00 AM. See op note below.  2. For the photodamage and solar lentigines, sun protection discussed/information given on OTC sunscreens, and we recommend continued regular follow-up with primary dermatologist every 6 months or sooner for any growing, bleeding, or changing lesions. 3. Prognosis and future surveillance discussed. 4. Letter with treatment outcome sent to referring provider. 5. Pain acetaminophen /ibuprofen  MOHS MICROGRAPHIC SURGERY AND RECONSTRUCTION  Initial size:   0.5 x 0.4 cm Surgical defect/wound size: 0.8 x 0.7 cm Anesthesia:    0.33% lidocaine with 1:200,000 epinephrine EBL:    <5 mL Complications:  None Repair type:   Complex SQ suture:   5-0 Monocryl Cutaneous suture:  6-0 Plain gut Final size of the repair: 2.9 cm  Stages: 1  STAGE I: Anesthesia achieved with 0.5% lidocaine with 1:200,000 epinephrine. ChloraPrep applied. 1 section(s) excised using Mohs technique (this includes total peripheral and deep tissue margin excision and evaluation with frozen sections, excised and interpreted by the same physician). The tumor was first debulked and then excised with an approx. 2mm margin.  Hemostasis was achieved with electrocautery as needed.  The specimen was then oriented, subdivided/relaxed, inked, and processed using Mohs technique.  Frozen section analysis revealed a clear deep and peripheral margin.  Reconstruction  The surgical wound was then  cleaned, prepped, and re-anesthetized as above. Wound edges were undermined extensively along at least one entire edge and at a distance equal to or greater than the width of the defect (see wound defect size above) in order to achieve closure and decrease wound tension and anatomic distortion. Redundant tissue repair including standing cone removal was performed. Hemostasis was achieved with electrocautery. Subcutaneous and epidermal tissues were approximated with the above sutures. The surgical site was then lightly scrubbed with sterile, saline-soaked gauze. The area was then bandaged using Vaseline ointment, non-adherent gauze, gauze pads, and tape to provide an adequate pressure dressing. The patient tolerated the procedure well, was given detailed written and verbal wound care instructions, and was discharged in good condition.   The patient will follow-up: 4 weeks.   Documentation: I have reviewed the above documentation for accuracy and completeness, and I agree with the above.  RUFUS CHRISTELLA HOLY, MD

## 2024-07-04 NOTE — Progress Notes (Signed)
 [facility] [address] [place] [address] Phone: [phone] Subjective:   Cassandra Garner, am serving as a scribe for Dr. Arthea Claudene.  I'm seeing this patient by the request  of:  Cassandra Garner ORN, MD  CC: Right knee pain  YEP:Cassandra Garner  05/24/2024 Questionable reactive bicep tendinitis.  Discussed potential injection with patient declined.  I do not feel that there is any tearing of the biceps or no PRP would be beneficial.  Discussed counterforce bracing icing and massage follow-up in 16 weeks     Seems that patient is doing significantly better when it comes to the rotator cuff at this time.  Significant improvement in range of motion as well as strength of the rotator cuff.  Unfortunately patient does have a reactive bicep tendinitis that I think is secondary to patient compensation.  We discussed with patient about icing regimen and home exercises otherwise.  Patient will follow-up with me again in 3 to 4 months otherwise.     Updated 07/05/2024 Cassandra Garner is a 68 y.o. female coming in with complaint of knee pain. R knee pain happened about 2 months ago doing tai chi. Standing with knee bent.  Flexion with pressure hurts the most. Feels like a band around knee.       Past Medical History:  Diagnosis Date   Allergy 1959   Penicillin, Sulfer drugs, adrenaline based/stimulating drugs   BCC (basal cell carcinoma of skin) 06/18/2024   left nasal sidewall - tx Mohs Dr. Corey 06/26/2024   Depression 2018   Diverticulitis 01/10/2018   Hypertension    PTSD (post-traumatic stress disorder)    Past Surgical History:  Procedure Laterality Date   BREAST SURGERY  See Plastic Surgery   See Plastic Surgery   COSMETIC SURGERY  1981, 1983, 2007, 2022   Breast   EYE SURGERY  2020?   Remove extra white part   PLACEMENT OF BREAST IMPLANTS     known ruptures   RHINOPLASTY     Social History   Socioeconomic History   Marital status:  Married    Spouse name: Not on file   Number of children: Not on file   Years of education: Not on file   Highest education level: Not on file  Occupational History   Not on file  Tobacco Use   Smoking status: Never   Smokeless tobacco: Never  Vaping Use   Vaping status: Never Used  Substance and Sexual Activity   Alcohol use: Yes    Alcohol/week: 1.0 standard drink of alcohol    Types: 1 Glasses of wine per week    Comment: 1-2 glass wine per wk   Drug use: Never   Sexual activity: Not Currently    Partners: Male  Other Topics Concern   Not on file  Social History Narrative   Lives with husband      One story home      Right handed      Highest level of edu- bachelors      Works part time   Social Drivers of Corporate Investment Banker Strain: Low Risk  (04/30/2024)   Overall Financial Resource Strain (CARDIA)    Difficulty of Paying Living Expenses: Not hard at all  Food Insecurity: No Food Insecurity (04/30/2024)   Hunger Vital Sign    Worried About Running Out of Food in the Last Year: Never true    Ran Out of Food in the Last Year:  Never true  Transportation Needs: No Transportation Needs (04/30/2024)   PRAPARE - Administrator, Civil Service (Medical): No    Lack of Transportation (Non-Medical): No  Physical Activity: Insufficiently Active (04/30/2024)   Exercise Vital Sign    Days of Exercise per Week: 3 days    Minutes of Exercise per Session: 30 min  Stress: No Stress Concern Present (04/30/2024)   [facility] of Occupational Health - Occupational Stress Questionnaire    Feeling of Stress: Only a little  Social Connections: Moderately Integrated (04/30/2024)   Social Connection and Isolation Panel    Frequency of Communication with Friends and Family: More than three times a week    Frequency of Social Gatherings with Friends and Family: More than three times a week    Attends Religious Services: Never    Database Administrator or Organizations:  Yes    Attends Engineer, Structural: More than 4 times per year    Marital Status: Married   Allergies  Allergen Reactions   Amlodipine     Dizzy, palpitations   Fentanyl  Other (See Comments)   Other Other (See Comments)   Penicillins Other (See Comments)    Has patient had a PCN reaction causing immediate rash, facial/tongue/throat swelling, SOB or lightheadedness with hypotension: Unknown Has patient had a PCN reaction causing severe rash involving mucus membranes or skin necrosis: No Has patient had a PCN reaction that required hospitalization: No Has patient had a PCN reaction occurring within the last 10 years: No If all of the above answers are NO, then may proceed with Cephalosporin use.    Albuterol Palpitations   Sulfa Antibiotics Rash   Family History  Problem Relation Age of Onset   Parkinson's disease Mother    Hypertension Father      Current Outpatient Medications (Cardiovascular):    benazepril  (LOTENSIN ) 40 MG tablet, TAKE 1 TABLET BY MOUTH EVERY DAY. APPOINTMENT DUE IN JUNE   Current Outpatient Medications (Analgesics):    meloxicam  (MOBIC ) 15 MG tablet, Take 1 tablet (15 mg total) by mouth daily. (Patient not taking: Reported on 06/26/2024)   Current Outpatient Medications (Other):    cholecalciferol (VITAMIN D3) 25 MCG (1000 UT) tablet, Take 1,000 Units by mouth daily.   fluorouracil (EFUDEX) 5 % cream, Apply topically 2 (two) times daily. Apply twice daily for 2 weeks and then STOP (Patient not taking: Reported on 06/26/2024)   hydrocortisone  2.5 % ointment, Apply topically 2 (two) times daily. (Patient not taking: Reported on 06/26/2024)   Reviewed prior external information including notes and imaging from  primary care provider As well as notes that were available from care everywhere and other healthcare systems.  Past medical history, social, surgical and family history all reviewed in electronic medical record.  No pertanent information  unless stated regarding to the chief complaint.   Review of Systems:  No headache, visual changes, nausea, vomiting, diarrhea, constipation, dizziness, abdominal pain, skin rash, fevers, chills, night sweats, weight loss, swollen lymph nodes, body aches, joint swelling, chest pain, shortness of breath, mood changes. POSITIVE muscle aches  Objective  Blood pressure 120/76, pulse 90, height 5' 4 (1.626 m), weight 129 lb (58.5 kg), SpO2 96%.   General: No apparent distress alert and oriented x3 mood and affect normal, dressed appropriately.  HEENT: Pupils equal, extraocular movements intact  Respiratory: Patient's speak in full sentences and does not appear short of breath  Cardiovascular: No lower extremity edema, non tender, no erythema  Antalgic gait noted.  Right knee does have some fullness noted.  Limited range of motion secondary to the effusion.  Limited muscular skeletal ultrasound was performed and interpreted by CLAUDENE HUSSAR, M  Limited ultrasound of patient's knee shows that there is hypoechoic changes noted within the patellofemoral joint.  Some thickening of the synovium that is consistent with a synovitis.  Patient's medial and lateral joint space appears to be unremarkable and meniscus appear to be unremarkable.  Baker's cyst is noted. Impression: Moderate to severe effusion noted of the patellofemoral joint with a Baker's cyst also noted  Procedure: Real-time Ultrasound Guided Injection of right knee Device: GE Logiq Q7 Ultrasound guided injection is preferred based studies that show increased duration, increased effect, greater accuracy, decreased procedural pain, increased response rate, and decreased cost with ultrasound guided versus blind injection.  Verbal informed consent obtained.  Time-out conducted.  Noted no overlying erythema, induration, or other signs of local infection.  Skin prepped in a sterile fashion.  Local anesthesia: Topical Ethyl chloride.  With  sterile technique and under real time ultrasound guidance: With a 22-gauge 2 inch needle patient was injected with 4 cc of 0.5% Marcaine and aspirated 25 cc of straw-colored fluid then injected 1 cc of Kenalog 40 mg/dL. This was from a superior lateral approach.  Completed without difficulty  Pain immediately resolved suggesting accurate placement of the medication.  Advised to call if fevers/chills, erythema, induration, drainage, or persistent bleeding.  Images permanently stored  Impression: Technically successful ultrasound guided injection.   Impression and Recommendations:     The above documentation has been reviewed and is accurate and complete Ely Spragg M Jac Romulus, DO

## 2024-07-05 ENCOUNTER — Other Ambulatory Visit: Payer: Self-pay

## 2024-07-05 ENCOUNTER — Ambulatory Visit: Admitting: Family Medicine

## 2024-07-05 ENCOUNTER — Encounter: Payer: Self-pay | Admitting: Family Medicine

## 2024-07-05 VITALS — BP 120/76 | HR 90 | Ht 64.0 in | Wt 129.0 lb

## 2024-07-05 DIAGNOSIS — M25561 Pain in right knee: Secondary | ICD-10-CM | POA: Diagnosis not present

## 2024-07-05 DIAGNOSIS — S83001A Unspecified subluxation of right patella, initial encounter: Secondary | ICD-10-CM

## 2024-07-05 NOTE — Patient Instructions (Addendum)
 Good to see you! Drained knee today You have 14 days to return or exchange your brace Call [phone], then return the brace to our office [facility]  See you again in 3 months

## 2024-07-05 NOTE — Assessment & Plan Note (Signed)
 Aspiration of the knee done today.  Tolerated the procedure well, discussed icing regimen and home exercises, discussed which activities to do, Tru pull lite brace given.  Discussed VMO strengthening exercises.  X-rays in the.  Worsening symptoms advanced imaging would be warranted.  Follow-up again in 2 months

## 2024-07-06 ENCOUNTER — Other Ambulatory Visit: Payer: Self-pay | Admitting: Internal Medicine

## 2024-07-06 ENCOUNTER — Other Ambulatory Visit: Payer: Self-pay

## 2024-07-06 ENCOUNTER — Ambulatory Visit: Payer: Self-pay | Admitting: Family Medicine

## 2024-07-06 LAB — SYNOVIAL FLUID ANALYSIS, COMPLETE
Basophils, %: 0 %
Eosinophils-Synovial: 0 % (ref 0–2)
Lymphocytes-Synovial Fld: 65 % (ref 0–74)
Monocyte/Macrophage: 25 % (ref 0–69)
Neutrophil, Synovial: 10 % (ref 0–24)
Synoviocytes, %: 0 % (ref 0–15)
WBC, Synovial: 462 {cells}/uL — ABNORMAL HIGH (ref <150)

## 2024-07-17 ENCOUNTER — Encounter: Payer: Self-pay | Admitting: Family Medicine

## 2024-07-17 ENCOUNTER — Ambulatory Visit (INDEPENDENT_AMBULATORY_CARE_PROVIDER_SITE_OTHER): Admitting: Family Medicine

## 2024-07-17 VITALS — BP 110/70 | HR 85 | Temp 98.3°F | Ht 64.0 in | Wt 131.0 lb

## 2024-07-17 DIAGNOSIS — J029 Acute pharyngitis, unspecified: Secondary | ICD-10-CM | POA: Diagnosis not present

## 2024-07-17 DIAGNOSIS — R051 Acute cough: Secondary | ICD-10-CM

## 2024-07-17 DIAGNOSIS — J028 Acute pharyngitis due to other specified organisms: Secondary | ICD-10-CM

## 2024-07-17 DIAGNOSIS — B9789 Other viral agents as the cause of diseases classified elsewhere: Secondary | ICD-10-CM | POA: Diagnosis not present

## 2024-07-17 DIAGNOSIS — R52 Pain, unspecified: Secondary | ICD-10-CM | POA: Diagnosis not present

## 2024-07-17 LAB — POCT INFLUENZA A/B
Influenza A, POC: NEGATIVE
Influenza B, POC: NEGATIVE

## 2024-07-17 LAB — POCT RAPID STREP A (OFFICE): Rapid Strep A Screen: NEGATIVE

## 2024-07-17 LAB — POC COVID19 BINAXNOW: SARS Coronavirus 2 Ag: NEGATIVE

## 2024-07-17 NOTE — Progress Notes (Signed)
 Subjective:    Discussed the use of AI scribe software for clinical note transcription with the patient, who gave verbal consent to proceed.  History of Present Illness Cassandra Garner is a 68 year old female who presents with a sore throat. She is accompanied by her husband.  Pharyngitis symptoms - Sore throat onset 3 days ago, worsening yesterday with a 'razory' sensation - Feverish sensation yesterday - Swollen lymph nodes and body aches yesterday - No cough at home, but talking during the visit provokes coughing - Concerned about possible streptococcal pharyngitis  Medication use and sensitivities - History of more noticeable side effects with Sudafed  Hydration status - Stays hydrated during illness, drinking large amounts of Gatorade when sick and switching to water once feeling better  No chills, dizziness, chest pain, palpitations, dyspnea, abdominal pain, vomiting or diarrhea.    ROS as in subjective.   Objective: Vitals:   07/17/24 1006  BP: 110/70  Pulse: 85  Temp: 98.3 F (36.8 C)  SpO2: 94%    General appearance: Alert, WD/WN, no distress, mildly ill appearing                             Skin: warm, no rash                           Head: no sinus tenderness                            Eyes: conjunctiva normal, corneas clear, PERRLA                            Ears: pearly TMs, external ear canals normal                          Nose: septum midline, turbinates swollen, with erythema and clear discharge             Mouth/throat: MMM, tongue normal, mild pharyngeal erythema                           Neck: supple, no adenopathy, no thyromegaly, nontender                          Heart: RRR                         Lungs: CTA bilaterally, no wheezes, rales, or rhonchi      Assessment/Plan:  Assessment and Plan Assessment & Plan Acute pharyngitis, unspecified organism Acute sore throat x 3 days, with significant discomfort yesterday. Negative for strep  throat, COVID, and flu. Possible viral etiology, potentially flu, given current surge. No pus or significant swelling in the throat.  Lungs are clear.  - Treat symptoms with acetaminophen , ibuprofen, saltwater gargles, and guaifenesin. - Stay hydrated with fluids like Gatorade. - Monitor for new symptoms such as shortness of breath or chest pain, which could indicate pneumonia. - Avoid antibiotics as there is no bacterial infection identified.  Body aches Reported body aches, likely associated with viral illness. No specific treatment required beyond symptom management. - Continue symptom management with acetaminophen  and ibuprofen.  Acute cough Cough not present at home, only when talking. - Continue  symptom management with guaifenesin and saltwater gargles.

## 2024-07-17 NOTE — Patient Instructions (Signed)
 You tested negative for COVID, flu and strep throat today in our office.  Stay hydrated  Try over-the-counter Mucinex dm or Robitussin DM for cough and congestion  Do salt water gargles and take Tylenol  or ibuprofen for sore throat, headache and bodyaches  Let us  know if you have any new or worsening symptoms

## 2024-07-23 ENCOUNTER — Encounter: Payer: Self-pay | Admitting: Family Medicine

## 2024-07-23 ENCOUNTER — Ambulatory Visit: Payer: Self-pay

## 2024-07-23 ENCOUNTER — Ambulatory Visit: Admitting: Family Medicine

## 2024-07-23 VITALS — BP 136/80 | HR 79 | Temp 98.5°F | Resp 18 | Ht 64.0 in | Wt 133.0 lb

## 2024-07-23 DIAGNOSIS — J0140 Acute pansinusitis, unspecified: Secondary | ICD-10-CM | POA: Diagnosis not present

## 2024-07-23 MED ORDER — DOXYCYCLINE HYCLATE 100 MG PO TABS: 100.0000 mg | ORAL_TABLET | Freq: Two times a day (BID) | ORAL | 0 refills | Status: AC

## 2024-07-23 MED ORDER — FLUCONAZOLE 150 MG PO TABS: 150.0000 mg | ORAL_TABLET | Freq: Once | ORAL | 0 refills | Status: AC

## 2024-07-23 NOTE — Telephone Encounter (Signed)
   FYI Only or Action Required?: FYI only for provider: appointment scheduled on 07/23/2024.  Patient was last seen in primary care on 07/17/2024 by Lendia Boby CROME, NP-C.  Called Nurse Triage reporting Cough.  Symptoms began a week ago.  Interventions attempted: OTC medications: Mucinex, Robitussin.  Symptoms are: gradually worsening.  Triage Disposition: See PCP When Office is Open (Within 3 Days)  Patient/caregiver understands and will follow disposition?: Copied from CRM #[id number]. Topic: Clinical - Red Word Triage >> Jul 23, 2024  9:21 AM Deaijah H wrote: Red Word that prompted transfer to Nurse Triage: Cold.. But additional symptoms cough is now phlegmy (heavy, ribs sore from coughing) , also thick and sticky, brown with some blood. Reason for Disposition  Cough has been present for > 3 weeks  Answer Assessment - Initial Assessment Questions 1. ONSET: When did the cough begin?      Last week.  2. SEVERITY: How bad is the cough today?      Worsening, ribs hurting from coughing 3. SPUTUM: Describe the color of your sputum (e.g., none, dry cough; clear, white, yellow, green)     Thick mucous  4. HEMOPTYSIS: Are you coughing up any blood? If Yes, ask: How much? (e.g., flecks, streaks, tablespoons, etc.)     Flecks of blood when she blows her nose  5. DIFFICULTY BREATHING: Are you having difficulty breathing? If Yes, ask: How bad is it? (e.g., mild, moderate, severe)      Denies 6. FEVER: Do you have a fever? If Yes, ask: What is your temperature, how was it measured, and when did it start?     Denies 7. CARDIAC HISTORY: Do you have any history of heart disease? (e.g., heart attack, congestive heart failure)      HTN 8. LUNG HISTORY: Do you have any history of lung disease?  (e.g., pulmonary embolus, asthma, emphysema)     Denies 9. PE RISK FACTORS: Do you have a history of blood clots? (or: recent major surgery, recent prolonged travel, bedridden)      Denies 10. OTHER SYMPTOMS: Do you have any other symptoms? (e.g., runny nose, wheezing, chest pain)       Rib pain, head pain  Protocols used: Cough - Acute Productive-A-AH

## 2024-07-23 NOTE — Progress Notes (Signed)
 Assessment & Plan Acute non-recurrent pansinusitis Symptoms suggest bacterial infection due to worsening after initial improvement. - Education provided on sinus infections. - Prescribed Diflucan for potential yeast infection, to be taken only if symptoms develop. - Advised taking probiotics with antibiotics. Orders:   doxycycline  (VIBRA -TABS) 100 MG tablet; Take 1 tablet (100 mg total) by mouth 2 (two) times daily for 7 days.   fluconazole (DIFLUCAN) 150 MG tablet; Take 1 tablet (150 mg total) by mouth once for 1 dose. May repeat after 3 days if needed.   Follow up plan: Return if symptoms worsen or fail to improve.  Niki Rung, MSN, APRN, FNP-C  Subjective:  HPI: Cassandra Garner is a 68 y.o. female presenting on 07/23/2024 for Cough (Cough, congestion- blood tinged and brownish, started with ST (first 24 hrs), no fever, body aches in the beginning /Started Sun one week ago (8 days) )  Discussed the use of AI scribe software for clinical note transcription with the patient, who gave verbal consent to proceed.  Patient is accompanied by her husband, who she is okay with being present.  Symptoms began last Sunday with a severe sore throat on Monday, described as feeling like 'razor blades'. Concerned about strep throat, she sought medical attention. Testing for COVID-19, flu, and strep throat were all performed and were negative.  Despite initial improvement, her symptoms worsened, with increased phlegm in her nose and a significant cough, particularly at night, causing discomfort in her ribs and head. She switched from Mucinex to Robitussin DM, which provided some relief for the cough, but she continued to experience severe coughing fits. The mucus from her nose had a red tinge last night.  She denies any history of asthma or COPD and does not smoke. She believes she is staying hydrated and has been using over-the-counter medications. She describes a sensation of pressure in her  chest during coughing spells and reports that her cough is loose and phlegmy. She also reports a peculiar breathing pattern when lying down, with labored breathing and a quiet exhale that produces a noise, which she attributes to phlegm.      ROS: Negative unless specifically indicated above in HPI.   Relevant past medical history reviewed and updated as indicated.   Allergies and medications reviewed and updated.   Current Outpatient Medications:    benazepril  (LOTENSIN ) 40 MG tablet, TAKE 1 TABLET BY MOUTH EVERY DAY, Disp: 90 tablet, Rfl: 3   cholecalciferol (VITAMIN D3) 25 MCG (1000 UT) tablet, Take 1,000 Units by mouth daily., Disp: , Rfl:    doxycycline  (VIBRA -TABS) 100 MG tablet, Take 1 tablet (100 mg total) by mouth 2 (two) times daily for 7 days., Disp: 14 tablet, Rfl: 0   fluconazole (DIFLUCAN) 150 MG tablet, Take 1 tablet (150 mg total) by mouth once for 1 dose. May repeat after 3 days if needed., Disp: 2 tablet, Rfl: 0  Allergies  Allergen Reactions   Amlodipine     Dizzy, palpitations   Fentanyl  Other (See Comments)   Other Other (See Comments)   Penicillins Other (See Comments)    Has patient had a PCN reaction causing immediate rash, facial/tongue/throat swelling, SOB or lightheadedness with hypotension: Unknown Has patient had a PCN reaction causing severe rash involving mucus membranes or skin necrosis: No Has patient had a PCN reaction that required hospitalization: No Has patient had a PCN reaction occurring within the last 10 years: No If all of the above answers are NO, then may proceed with Cephalosporin  use.    Albuterol Palpitations   Sulfa Antibiotics Rash    Objective:   BP 136/80   Pulse 79   Temp 98.5 F (36.9 C)   Resp 18   Ht 5' 4 (1.626 m)   Wt 133 lb (60.3 kg)   SpO2 95%   BMI 22.83 kg/m    Physical Exam Vitals reviewed.  Constitutional:      General: She is not in acute distress.    Appearance: Normal appearance. She is not  ill-appearing, toxic-appearing or diaphoretic.  HENT:     Head: Normocephalic and atraumatic.     Right Ear: Tympanic membrane, ear canal and external ear normal. There is no impacted cerumen.     Left Ear: Tympanic membrane, ear canal and external ear normal. There is no impacted cerumen.     Nose: Congestion present. No rhinorrhea.     Right Sinus: Maxillary sinus tenderness and frontal sinus tenderness present.     Left Sinus: Maxillary sinus tenderness and frontal sinus tenderness present.     Mouth/Throat:     Mouth: Mucous membranes are moist.     Pharynx: Oropharynx is clear. No oropharyngeal exudate or posterior oropharyngeal erythema.  Eyes:     General: No scleral icterus.       Right eye: No discharge.        Left eye: No discharge.     Conjunctiva/sclera: Conjunctivae normal.  Cardiovascular:     Rate and Rhythm: Normal rate and regular rhythm.     Heart sounds: Normal heart sounds. No murmur heard.    No friction rub. No gallop.  Pulmonary:     Effort: Pulmonary effort is normal. No respiratory distress.     Breath sounds: Normal breath sounds. No stridor. No wheezing, rhonchi or rales.  Musculoskeletal:        General: Normal range of motion.     Cervical back: Normal range of motion.  Lymphadenopathy:     Cervical: Cervical adenopathy present.  Skin:    General: Skin is warm and dry.     Capillary Refill: Capillary refill takes less than 2 seconds.  Neurological:     General: No focal deficit present.     Mental Status: She is alert and oriented to person, place, and time. Mental status is at baseline.  Psychiatric:        Mood and Affect: Mood normal.        Behavior: Behavior normal.        Thought Content: Thought content normal.        Judgment: Judgment normal.

## 2024-07-25 ENCOUNTER — Encounter: Payer: Self-pay | Admitting: Dermatology

## 2024-07-25 ENCOUNTER — Ambulatory Visit: Admitting: Dermatology

## 2024-07-25 VITALS — BP 138/88 | HR 76

## 2024-07-25 DIAGNOSIS — L905 Scar conditions and fibrosis of skin: Secondary | ICD-10-CM

## 2024-07-25 DIAGNOSIS — Z85828 Personal history of other malignant neoplasm of skin: Secondary | ICD-10-CM | POA: Diagnosis not present

## 2024-07-25 DIAGNOSIS — C4491 Basal cell carcinoma of skin, unspecified: Secondary | ICD-10-CM

## 2024-07-25 DIAGNOSIS — L720 Epidermal cyst: Secondary | ICD-10-CM

## 2024-07-25 NOTE — Patient Instructions (Addendum)

## 2024-07-25 NOTE — Progress Notes (Signed)
   Follow Up Visit   Subjective  Cassandra Garner is a 68 y.o. 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 The patient has been bandaging the wound as directed. The endorse the following concerns: none. Patient accompanied by her spouse.   The following portions of the chart were reviewed this encounter and updated as appropriate: medications, allergies, medical history  Review of Systems:  No other skin or systemic complaints except as noted in HPI or Assessment and Plan.  Objective  Well appearing patient in no apparent distress; mood and affect are within normal limits.  A focal examination was performed including face and All findings within normal limits unless otherwise noted below.  Healing wound with mild erythema  Relevant physical exam findings are noted in the Assessment and Plan.      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 , treated on 06/26/24, repaired with linear closure - Reassured that wound is healing well - No evidence of infection - No swelling, induration, purulence, dehiscence, or tenderness out of proportion to the clinical exam, see photo above - Discussed that scars take up to 12 months to mature from the date of surgery - Recommend SPF 30+ to scar daily to prevent purple color from UV exposure during scar maturation process - Discussed that erythema and raised appearance of scar will fade over the next 4-6 months - OK to start scar massage at 4-6 weeks post-op - Can consider silicone based products for scar healing starting at 6 weeks post-op  HISTORY OF BASAL CELL CARCINOMA OF THE SKIN - No evidence of recurrence today - Recommend regular full body skin exams - Recommend daily broad spectrum sunscreen SPF  30+ to sun-exposed areas, reapply every 2 hours as needed.  - Call if any new or changing lesions are noted between office visits  EPIDERMAL INCLUSION CYST Exam: Subcutaneous nodule at right chest  Benign-appearing. Exam most consistent with an epidermal inclusion cyst. Discussed that a cyst is a benign growth that can grow over time and sometimes get irritated or inflamed. Recommend observation if it is not bothersome.    Return in 3 months (on 10/23/2024), or if symptoms worsen or fail to improve, for wound check.  I, Doyce Pan, CMA, am acting as scribe for RUFUS CHRISTELLA HOLY, MD.   Documentation: I have reviewed the above documentation for accuracy and completeness, and I agree with the above.  RUFUS CHRISTELLA HOLY, MD

## 2024-08-08 ENCOUNTER — Ambulatory Visit: Payer: Self-pay | Admitting: Internal Medicine

## 2024-08-08 ENCOUNTER — Ambulatory Visit: Payer: Medicare Other | Admitting: Internal Medicine

## 2024-08-08 ENCOUNTER — Ambulatory Visit

## 2024-08-08 ENCOUNTER — Encounter: Payer: Self-pay | Admitting: Dermatology

## 2024-08-08 VITALS — BP 138/86 | HR 78 | Temp 98.0°F | Ht 64.0 in | Wt 130.4 lb

## 2024-08-08 DIAGNOSIS — R058 Other specified cough: Secondary | ICD-10-CM | POA: Diagnosis not present

## 2024-08-08 DIAGNOSIS — M8580 Other specified disorders of bone density and structure, unspecified site: Secondary | ICD-10-CM | POA: Diagnosis not present

## 2024-08-08 DIAGNOSIS — E559 Vitamin D deficiency, unspecified: Secondary | ICD-10-CM | POA: Diagnosis not present

## 2024-08-08 DIAGNOSIS — E538 Deficiency of other specified B group vitamins: Secondary | ICD-10-CM | POA: Diagnosis not present

## 2024-08-08 DIAGNOSIS — F419 Anxiety disorder, unspecified: Secondary | ICD-10-CM

## 2024-08-08 DIAGNOSIS — I10 Essential (primary) hypertension: Secondary | ICD-10-CM | POA: Diagnosis not present

## 2024-08-08 DIAGNOSIS — R739 Hyperglycemia, unspecified: Secondary | ICD-10-CM | POA: Diagnosis not present

## 2024-08-08 DIAGNOSIS — E2839 Other primary ovarian failure: Secondary | ICD-10-CM

## 2024-08-08 DIAGNOSIS — E039 Hypothyroidism, unspecified: Secondary | ICD-10-CM

## 2024-08-08 LAB — URINALYSIS, ROUTINE W REFLEX MICROSCOPIC
Bilirubin Urine: NEGATIVE
Hgb urine dipstick: NEGATIVE
Ketones, ur: NEGATIVE
Leukocytes,Ua: NEGATIVE
Nitrite: NEGATIVE
RBC / HPF: NONE SEEN (ref 0–?)
Specific Gravity, Urine: 1.015 (ref 1.000–1.030)
Total Protein, Urine: NEGATIVE
Urine Glucose: NEGATIVE
Urobilinogen, UA: 0.2 (ref 0.0–1.0)
pH: 7 (ref 5.0–8.0)

## 2024-08-08 LAB — LIPID PANEL
Cholesterol: 170 mg/dL (ref 28–200)
HDL: 77.6 mg/dL (ref >39.00)
LDL Cholesterol: 77 mg/dL (ref 10–99)
NonHDL: 92.61
Total CHOL/HDL Ratio: 2
Triglycerides: 80 mg/dL (ref 10.0–149.0)
VLDL: 16 mg/dL (ref 0.0–40.0)

## 2024-08-08 LAB — POCT INFLUENZA A/B
Influenza A, POC: NEGATIVE
Influenza B, POC: NEGATIVE

## 2024-08-08 LAB — BASIC METABOLIC PANEL WITH GFR
BUN: 10 mg/dL (ref 6–23)
CO2: 31 meq/L (ref 19–32)
Calcium: 9 mg/dL (ref 8.4–10.5)
Chloride: 104 meq/L (ref 96–112)
Creatinine, Ser: 0.76 mg/dL (ref 0.40–1.20)
GFR: 81.26 mL/min (ref >60.00)
Glucose, Bld: 94 mg/dL (ref 70–99)
Potassium: 3.9 meq/L (ref 3.5–5.1)
Sodium: 142 meq/L (ref 135–145)

## 2024-08-08 LAB — CBC WITH DIFFERENTIAL/PLATELET
Basophils Absolute: 0 K/uL (ref 0.0–0.1)
Basophils Relative: 0.7 % (ref 0.0–3.0)
Eosinophils Absolute: 0.3 K/uL (ref 0.0–0.7)
Eosinophils Relative: 6.4 % — ABNORMAL HIGH (ref 0.0–5.0)
HCT: 41 % (ref 36.0–46.0)
Hemoglobin: 14.1 g/dL (ref 12.0–15.0)
Lymphocytes Relative: 31.2 % (ref 12.0–46.0)
Lymphs Abs: 1.6 K/uL (ref 0.7–4.0)
MCHC: 34.3 g/dL (ref 30.0–36.0)
MCV: 89.8 fl (ref 78.0–100.0)
Monocytes Absolute: 0.5 K/uL (ref 0.1–1.0)
Monocytes Relative: 10.3 % (ref 3.0–12.0)
Neutro Abs: 2.6 K/uL (ref 1.4–7.7)
Neutrophils Relative %: 51.4 % (ref 43.0–77.0)
Platelets: 257 K/uL (ref 150.0–400.0)
RBC: 4.57 Mil/uL (ref 3.87–5.11)
RDW: 13.2 % (ref 11.5–15.5)
WBC: 5 K/uL (ref 4.0–10.5)

## 2024-08-08 LAB — TSH: TSH: 4.12 u[IU]/mL (ref 0.35–5.50)

## 2024-08-08 LAB — HEPATIC FUNCTION PANEL
ALT: 17 U/L (ref 3–35)
AST: 21 U/L (ref 5–37)
Albumin: 4.3 g/dL (ref 3.5–5.2)
Alkaline Phosphatase: 63 U/L (ref 39–117)
Bilirubin, Direct: 0.1 mg/dL (ref 0.1–0.3)
Total Bilirubin: 0.6 mg/dL (ref 0.2–1.2)
Total Protein: 7.1 g/dL (ref 6.0–8.3)

## 2024-08-08 LAB — VITAMIN D 25 HYDROXY (VIT D DEFICIENCY, FRACTURES): VITD: 36.47 ng/mL (ref 30.00–100.00)

## 2024-08-08 LAB — POC COVID19 BINAXNOW: SARS Coronavirus 2 Ag: NEGATIVE

## 2024-08-08 LAB — HEMOGLOBIN A1C: Hgb A1c MFr Bld: 5.6 % (ref 4.6–6.5)

## 2024-08-08 LAB — VITAMIN B12: Vitamin B-12: 728 pg/mL (ref 211–911)

## 2024-08-08 MED ORDER — LEVOFLOXACIN 500 MG PO TABS: 500.0000 mg | ORAL_TABLET | Freq: Every day | ORAL | 0 refills | Status: AC

## 2024-08-08 NOTE — Progress Notes (Signed)
 The test results show that your current treatment is OK, as the tests are stable.  Please continue the same plan.  There is no other need for change of treatment or further evaluation based on these results, at this time.  thanks

## 2024-08-08 NOTE — Patient Instructions (Addendum)
 Your COVID and Flu testing was done today - both negative  Please take all new medication as prescribed - the antibiotic, and cough medicine as needed  Please continue all other medications as before, and refills have been done if requested.  Please have the pharmacy call with any other refills you may need.  Please continue your efforts at being more active, low cholesterol diet, and weight control.  You are otherwise up to date with prevention measures today.  Please keep your appointments with your specialists as you may have planned  You will be contacted regarding the referral for: Bone Density testing  Please go to the XRAY Department in the first floor for the x-ray testing  Please go to the LAB at the blood drawing area for the tests to be done  You will be contacted by phone if any changes need to be made immediately.  Otherwise, you will receive a letter about your results with an explanation, but please check with MyChart first.  Please make an Appointment to return in 6 months, or sooner if needed

## 2024-08-08 NOTE — Progress Notes (Unsigned)
 Patient ID: Cassandra Garner, female   DOB: [birth year], 68 y.o.   MRN: [id number]         Chief Complaint::  yearly exam       HPI:  Cassandra Garner is a 68 y.o. female here    Wt Readings from Last 3 Encounters:  08/08/24 130 lb 6 oz (59.1 kg)  07/23/24 133 lb (60.3 kg)  07/17/24 131 lb (59.4 kg)   BP Readings from Last 3 Encounters:  08/08/24 138/86  07/25/24 138/88  07/23/24 136/80   Immunization History  Administered Date(s) Administered   Tdap 04/19/2008, 07/10/2018   Health Maintenance Due  Topic Date Due   COVID-19 Vaccine (1) Never done   Zoster Vaccines- Shingrix (1 of 2) Never done   Pneumococcal Vaccine: 50+ Years (1 of 1 - PCV) Never done      Past Medical History:  Diagnosis Date   Allergy 1959   Penicillin, Sulfer drugs, adrenaline based/stimulating drugs   BCC (basal cell carcinoma of skin) 06/18/2024   left nasal sidewall - tx Mohs Dr. Corey 06/26/2024   Depression 2018   Diverticulitis 01/10/2018   Hypertension 2015?   PTSD (post-traumatic stress disorder)    Past Surgical History:  Procedure Laterality Date   BREAST SURGERY  See Plastic Surgery   See Plastic Surgery   COSMETIC SURGERY  1981, 1983, 2007, 2022   Breast   EYE SURGERY  2020?   Remove extra white part   PLACEMENT OF BREAST IMPLANTS     known ruptures   RHINOPLASTY      reports that she has never smoked. She has never used smokeless tobacco. She reports current alcohol use of about 1.0 standard drink of alcohol per week. She reports that she does not use drugs. family history includes Hypertension in her father; Parkinson's disease in her mother. Allergies[1] Medications Ordered Prior to Encounter[2]      ROS:  All others reviewed and negative.  Objective        PE:  BP 138/86   Pulse 78   Temp 98 F (36.7 C) (Temporal)   Ht 5' 4 (1.626 m)   Wt 130 lb 6 oz (59.1 kg)   SpO2 97%   BMI 22.38 kg/m                 Constitutional: Pt appears in NAD               HENT:  Head: NCAT.                Right Ear: External ear normal.                 Left Ear: External ear normal.                Eyes: . Pupils are equal, round, and reactive to light. Conjunctivae and EOM are normal               Nose: without d/c or deformity               Neck: Neck supple. Gross normal ROM               Cardiovascular: Normal rate and regular rhythm.                 Pulmonary/Chest: Effort normal and breath sounds without rales or wheezing.                Abd:  Soft, NT, ND, +  BS, no organomegaly               Neurological: Pt is alert. At baseline orientation, motor grossly intact               Skin: Skin is warm. No rashes, no other new lesions, LE edema - ***               Psychiatric: Pt behavior is normal without agitation   Micro: none  Cardiac tracings I have personally interpreted today:  none  Pertinent Radiological findings (summarize): none   Lab Results  Component Value Date   WBC 5.3 08/04/2022   HGB 14.6 08/04/2022   HCT 43.6 08/04/2022   PLT 256.0 08/04/2022   GLUCOSE 90 08/04/2022   CHOL 167 08/04/2022   TRIG 132.0 08/04/2022   HDL 66.90 08/04/2022   LDLCALC 74 08/04/2022   ALT 15 08/04/2022   AST 19 08/04/2022   NA 141 08/04/2022   K 3.9 08/04/2022   CL 105 08/04/2022   CREATININE 0.70 08/04/2022   BUN 10 08/04/2022   CO2 30 08/04/2022   TSH 4.01 08/04/2022   HGBA1C 5.6 08/04/2022   POCT - COVID - neg,  Flu - neg  Assessment/Plan:  Cassandra Garner is a 68 y.o. White or Caucasian [1] female with  has a past medical history of Allergy (1959), BCC (basal cell carcinoma of skin) (06/18/2024), Depression (2018), Diverticulitis (01/10/2018), Hypertension (2015?), and PTSD (post-traumatic stress disorder).  No problem-specific Assessment & Plan notes found for this encounter.  Followup: No follow-ups on file.  Cassandra Rush, MD 08/08/2024 8:37 AM [facility]     [1]   Allergies Allergen Reactions   Amlodipine     Dizzy, palpitations   Fentanyl  Other (See Comments)   Other Other (See Comments)   Penicillins Other (See Comments)    Has patient had a PCN reaction causing immediate rash, facial/tongue/throat swelling, SOB or lightheadedness with hypotension: Unknown Has patient had a PCN reaction causing severe rash involving mucus membranes or skin necrosis: No Has patient had a PCN reaction that required hospitalization: No Has patient had a PCN reaction occurring within the last 10 years: No If all of the above answers are NO, then may proceed with Cephalosporin use.    Albuterol Palpitations   Sulfa Antibiotics Rash  [2]  Current Outpatient Medications on File Prior to Visit  Medication Sig Dispense Refill   benazepril  (LOTENSIN ) 40 MG tablet TAKE 1 TABLET BY MOUTH EVERY DAY 90 tablet 3   cholecalciferol (VITAMIN D3) 25 MCG (1000 UT) tablet Take 1,000 Units by mouth daily.     No current facility-administered medications on file prior to visit.

## 2024-08-09 ENCOUNTER — Encounter: Payer: Self-pay | Admitting: Internal Medicine

## 2024-08-09 DIAGNOSIS — R058 Other specified cough: Secondary | ICD-10-CM | POA: Insufficient documentation

## 2024-08-09 DIAGNOSIS — E559 Vitamin D deficiency, unspecified: Secondary | ICD-10-CM | POA: Insufficient documentation

## 2024-08-09 NOTE — Assessment & Plan Note (Addendum)
 Mild to mod, c/w bronchitis vs pna, for cxr, for antibx course levaquin  500 mg every day course,  to f/u any worsening symptoms or concerns

## 2024-08-09 NOTE — Assessment & Plan Note (Signed)
 BP Readings from Last 3 Encounters:  08/08/24 138/86  07/25/24 138/88  07/23/24 136/80   Stable, pt to continue medical treatment lotensin  40 mg qd

## 2024-08-09 NOTE — Assessment & Plan Note (Signed)
 Lab Results  Component Value Date   HGBA1C 5.6 08/08/2024   Stable, pt to continue current medical treatment - diet, wt control

## 2024-08-09 NOTE — Assessment & Plan Note (Signed)
 Last vitamin D  Lab Results  Component Value Date   VD25OH 36.47 08/08/2024  Low, to start oral replacement

## 2024-08-09 NOTE — Assessment & Plan Note (Signed)
 Lab Results  Component Value Date   TSH 4.12 08/08/2024   Stable, pt to continue to not require levothyroxine at this time

## 2024-08-09 NOTE — Assessment & Plan Note (Signed)
 Mod to severe today, likely reactive due to illness it seems, cont current med tx

## 2024-08-09 NOTE — Assessment & Plan Note (Signed)
 For DXA to followup any change

## 2024-08-29 ENCOUNTER — Encounter: Admitting: Family Medicine

## 2024-09-03 NOTE — Progress Notes (Unsigned)
 " [facility] [address] [place] [address] Phone: [phone] Subjective:   [name] [name], am serving as a scribe for Dr. Arthea Claudene.  I'm seeing this patient by the request  of:  Norleen Lynwood ORN, MD  CC: Knee pain  YEP:Dlagzrupcz  07/05/2024 Aspiration of the knee done today.  Tolerated the procedure well, discussed icing regimen and home exercises, discussed which activities to do, Tru pull lite brace given.  Discussed VMO strengthening exercises.  X-rays in the.  Worsening symptoms advanced imaging would be warranted.  Follow-up again in 2 months     Updated 09/04/2024 Cassandra Garner is a 69 y.o. female coming in with complaint of R knee pain, was found to have a knee inflammation noted.  Given exercises as well as brace after aspiration. Knee improved but she has twisted knee a couple of times in her house which irritate the knee. Has been doing knee extensions. Had some pain over patella.  At night she has been getting cramps. Taking calcium and magnesium.      Past Medical History:  Diagnosis Date   Allergy 1959   Penicillin, Sulfer drugs, adrenaline based/stimulating drugs   BCC (basal cell carcinoma of skin) 06/18/2024   left nasal sidewall - tx Mohs Dr. Corey 06/26/2024   Depression 2018   Diverticulitis 01/10/2018   Hypertension 2015?   PTSD (post-traumatic stress disorder)    Past Surgical History:  Procedure Laterality Date   BREAST SURGERY  See Plastic Surgery   See Plastic Surgery   COSMETIC SURGERY  1981, 1983, 2007, 2022   Breast   EYE SURGERY  2020?   Remove extra white part   PLACEMENT OF BREAST IMPLANTS     known ruptures   RHINOPLASTY     Social History   Socioeconomic History   Marital status: Married    Spouse name: Not on file   Number of children: Not on file   Years of education: Not on file   Highest education level: Not on file  Occupational History   Not on file  Tobacco Use   Smoking  status: Never   Smokeless tobacco: Never  Vaping Use   Vaping status: Never Used  Substance and Sexual Activity   Alcohol use: Yes    Alcohol/week: 1.0 standard drink of alcohol    Types: 1 Glasses of wine per week    Comment: 1-2 glass wine per wk   Drug use: Never   Sexual activity: Not Currently    Partners: Male  Other Topics Concern   Not on file  Social History Narrative   Lives with husband      One story home      Right handed      Highest level of edu- bachelors      Works part time   Social Drivers of Health   Tobacco Use: Low Risk (08/09/2024)   Patient History    Smoking Tobacco Use: Never    Smokeless Tobacco Use: Never    Passive Exposure: Not on file  Financial Resource Strain: Low Risk (04/30/2024)   Overall Financial Resource Strain (CARDIA)    Difficulty of Paying Living Expenses: Not hard at all  Food Insecurity: No Food Insecurity (04/30/2024)   Epic    Worried About Radiation Protection Practitioner of Food in the Last Year: Never true    Ran Out of Food in the Last Year: Never true  Transportation Needs: No Transportation Needs (04/30/2024)  Epic    Lack of Transportation (Medical): No    Lack of Transportation (Non-Medical): No  Physical Activity: Insufficiently Active (04/30/2024)   Exercise Vital Sign    Days of Exercise per Week: 3 days    Minutes of Exercise per Session: 30 min  Stress: No Stress Concern Present (04/30/2024)   [employer] of Occupational Health - Occupational Stress Questionnaire    Feeling of Stress: Only a little  Social Connections: Moderately Integrated (04/30/2024)   Social Connection and Isolation Panel    Frequency of Communication with Friends and Family: More than three times a week    Frequency of Social Gatherings with Friends and Family: More than three times a week    Attends Religious Services: Never    Database Administrator or Organizations: Yes    Attends Banker Meetings: More than 4 times per year    Marital  Status: Married  Depression (PHQ2-9): Low Risk (07/23/2024)   Depression (PHQ2-9)    PHQ-2 Score: 0  Alcohol Screen: Low Risk (04/30/2024)   Alcohol Screen    Last Alcohol Screening Score (AUDIT): 1  Housing: Unknown (04/30/2024)   Epic    Unable to Pay for Housing in the Last Year: No    Number of Times Moved in the Last Year: Not on file    Homeless in the Last Year: No  Utilities: Not At Risk (04/30/2024)   Epic    Threatened with loss of utilities: No  Health Literacy: Adequate Health Literacy (04/30/2024)   B1300 Health Literacy    Frequency of need for help with medical instructions: Never   Allergies[1] Family History  Problem Relation Age of Onset   Parkinson's disease Mother    Hypertension Father     Current Outpatient Medications (Cardiovascular):    benazepril  (LOTENSIN ) 40 MG tablet, TAKE 1 TABLET BY MOUTH EVERY DAY  Current Outpatient Medications (Other):    cholecalciferol (VITAMIN D3) 25 MCG (1000 UT) tablet, Take 1,000 Units by mouth daily.   levofloxacin  (LEVAQUIN ) 500 MG tablet, Take 1 tablet (500 mg total) by mouth daily.   Reviewed prior external information including notes and imaging from  primary care provider As well as notes that were available from care everywhere and other healthcare systems.  Past medical history, social, surgical and family history all reviewed in electronic medical record.  No pertanent information unless stated regarding to the chief complaint.   Review of Systems:  No headache, visual changes, nausea, vomiting, diarrhea, constipation, dizziness, abdominal pain, skin rash, fevers, chills, night sweats, weight loss, swollen lymph nodes, body aches, joint swelling, chest pain, shortness of breath, mood changes. POSITIVE muscle aches  Objective  Blood pressure 122/82, height 5' 4 (1.626 m), weight 132 lb (59.9 kg).   General: No apparent distress alert and oriented x3 mood and affect normal, dressed appropriately.  HEENT: Pupils  equal, extraocular movements intact  Respiratory: Patient's speak in full sentences and does not appear short of breath  Knee exam shows right knee has no significant swelling at the moment.  Patient has improvement in range of motion.  Still has a positive patella apprehension test noted.   Limited muscular skeletal ultrasound was performed and interpreted by CLAUDENE HUSSAR, M  Limited ultrasound shows some mild hypoechoic changes of the patellofemoral joint but significant improvement from previous exam.  Medial meniscus has some very mild displacement but no true acute tear appreciated.   Impression and Recommendations:     The above documentation  has been reviewed and is accurate and complete Arthea CHRISTELLA Sharps, DO       [1]  Allergies Allergen Reactions   Amlodipine     Dizzy, palpitations   Fentanyl  Other (See Comments)   Other Other (See Comments)   Penicillins Other (See Comments)    Has patient had a PCN reaction causing immediate rash, facial/tongue/throat swelling, SOB or lightheadedness with hypotension: Unknown Has patient had a PCN reaction causing severe rash involving mucus membranes or skin necrosis: No Has patient had a PCN reaction that required hospitalization: No Has patient had a PCN reaction occurring within the last 10 years: No If all of the above answers are NO, then may proceed with Cephalosporin use.    Albuterol Palpitations   Sulfa Antibiotics Rash   "

## 2024-09-04 ENCOUNTER — Encounter: Payer: Self-pay | Admitting: Family Medicine

## 2024-09-04 ENCOUNTER — Ambulatory Visit: Admitting: Family Medicine

## 2024-09-04 ENCOUNTER — Other Ambulatory Visit

## 2024-09-04 VITALS — BP 122/82 | Ht 64.0 in | Wt 132.0 lb

## 2024-09-04 DIAGNOSIS — E611 Iron deficiency: Secondary | ICD-10-CM

## 2024-09-04 DIAGNOSIS — M25561 Pain in right knee: Secondary | ICD-10-CM | POA: Diagnosis not present

## 2024-09-04 DIAGNOSIS — R252 Cramp and spasm: Secondary | ICD-10-CM | POA: Insufficient documentation

## 2024-09-04 DIAGNOSIS — G8929 Other chronic pain: Secondary | ICD-10-CM

## 2024-09-04 DIAGNOSIS — R5383 Other fatigue: Secondary | ICD-10-CM | POA: Diagnosis not present

## 2024-09-04 DIAGNOSIS — S83001A Unspecified subluxation of right patella, initial encounter: Secondary | ICD-10-CM | POA: Diagnosis not present

## 2024-09-04 LAB — CBC WITH DIFFERENTIAL/PLATELET
Basophils Absolute: 0 K/uL (ref 0.0–0.1)
Basophils Relative: 0.6 % (ref 0.0–3.0)
Eosinophils Absolute: 0.3 K/uL (ref 0.0–0.7)
Eosinophils Relative: 3.7 % (ref 0.0–5.0)
HCT: 41.9 % (ref 36.0–46.0)
Hemoglobin: 14.4 g/dL (ref 12.0–15.0)
Lymphocytes Relative: 26.6 % (ref 12.0–46.0)
Lymphs Abs: 1.9 K/uL (ref 0.7–4.0)
MCHC: 34.3 g/dL (ref 30.0–36.0)
MCV: 88.4 fl (ref 78.0–100.0)
Monocytes Absolute: 0.5 K/uL (ref 0.1–1.0)
Monocytes Relative: 6.7 % (ref 3.0–12.0)
Neutro Abs: 4.5 K/uL (ref 1.4–7.7)
Neutrophils Relative %: 62.4 % (ref 43.0–77.0)
Platelets: 269 K/uL (ref 150.0–400.0)
RBC: 4.74 Mil/uL (ref 3.87–5.11)
RDW: 13 % (ref 11.5–15.5)
WBC: 7.2 K/uL (ref 4.0–10.5)

## 2024-09-04 LAB — FERRITIN: Ferritin: 37.1 ng/mL (ref 10.0–291.0)

## 2024-09-04 LAB — IBC PANEL
Iron: 83 ug/dL (ref 42–145)
Saturation Ratios: 25.9 % (ref 20.0–50.0)
TIBC: 320.6 ug/dL (ref 250.0–450.0)
Transferrin: 229 mg/dL (ref 212.0–360.0)

## 2024-09-04 NOTE — Patient Instructions (Addendum)
 Labs today VMO strength OK to start Tai Chi in February with brace If labs are normal we will get sleep study See me in 2-3 months

## 2024-09-04 NOTE — Assessment & Plan Note (Signed)
 Describing leg cramping, will get iron levels.  If normal then need to consider a sleep study with that being nocturnal.  Questionable snoring

## 2024-09-04 NOTE — Assessment & Plan Note (Addendum)
 Significant improvement at this time.  Discussed icing regimen and home exercises.  Eccentric exercises given.  Increase activity slowly.  Follow-up again in 6 to 12 weeks

## 2024-09-05 ENCOUNTER — Ambulatory Visit: Payer: Self-pay | Admitting: Family Medicine

## 2024-09-05 ENCOUNTER — Other Ambulatory Visit: Payer: Self-pay

## 2024-09-05 DIAGNOSIS — R0683 Snoring: Secondary | ICD-10-CM

## 2024-09-05 NOTE — Progress Notes (Signed)
 Referral placed.

## 2024-09-28 ENCOUNTER — Encounter: Payer: Self-pay | Admitting: Family Medicine

## 2024-09-28 ENCOUNTER — Ambulatory Visit: Admitting: Family Medicine

## 2024-09-28 NOTE — Progress Notes (Unsigned)
 "    Patient Care Team: Prentiss Frieze, DO as PCP - General (Family Medicine) Patrcia Sharper, MD as Consulting Physician (Ophthalmology) Prentiss Frieze, DO as Referring Physician (Family Medicine) Prentiss Frieze, DO as Referring Physician (Family Medicine)  Diagnoses and Orders:   No diagnosis found. No orders of the defined types were placed in this encounter.  No orders of the defined types were placed in this encounter.  Assessment & Plan:   Assessment & Plan    Subjective:   History of Present Illness   Review of Systems: Negative, with the exception of above mentioned in HPI.  History:   Reviewed by clinician on day of visit: allergies, medications, problem list, medical history, surgical history, family history, social history, and previous encounter notes.  Medications:   Show/hide medication list[1] Allergies[2]  Physical Exam:   There were no vitals taken for this visit.  Physical Exam Laboratory and Imaging:   {Insert previous labs (optional):23779} {See past labs  Heme  Chem  Endocrine  Serology  Results Review (optional):1} Results for orders placed or performed in visit on 09/04/24  IBC panel   Collection Time: 09/04/24  3:02 PM  Result Value Ref Range   Iron 83 42 - 145 ug/dL   Transferrin 770.9 787.9 - 360.0 mg/dL   Saturation Ratios 74.0 20.0 - 50.0 %   TIBC 320.6 250.0 - 450.0 mcg/dL  Ferritin   Collection Time: 09/04/24  3:02 PM  Result Value Ref Range   Ferritin 37.1 10.0 - 291.0 ng/mL  CBC with Differential/Platelet   Collection Time: 09/04/24  3:02 PM  Result Value Ref Range   WBC 7.2 4.0 - 10.5 K/uL   RBC 4.74 3.87 - 5.11 Mil/uL   Hemoglobin 14.4 12.0 - 15.0 g/dL   HCT 58.0 63.9 - 53.9 %   MCV 88.4 78.0 - 100.0 fl   MCHC 34.3 30.0 - 36.0 g/dL   RDW 86.9 88.4 - 84.4 %   Platelets 269.0 150.0 - 400.0 K/uL   Neutrophils Relative % 62.4 43.0 - 77.0 %   Lymphocytes Relative 26.6 12.0 - 46.0 %   Monocytes Relative 6.7 3.0 -  12.0 %   Eosinophils Relative 3.7 0.0 - 5.0 %   Basophils Relative 0.6 0.0 - 3.0 %   Neutro Abs 4.5 1.4 - 7.7 K/uL   Lymphs Abs 1.9 0.7 - 4.0 K/uL   Monocytes Absolute 0.5 0.1 - 1.0 K/uL   Eosinophils Absolute 0.3 0.0 - 0.7 K/uL   Basophils Absolute 0.0 0.0 - 0.1 K/uL    Screening:   09/28/2024    PHQ2-9 Depression Screening   Little interest or pleasure in doing things    Feeling down, depressed, or hopeless    PHQ-2 - Total Score    Trouble falling or staying asleep, or sleeping too much    Feeling tired or having little energy    Poor appetite or overeating     Feeling bad about yourself - or that you are a failure or have let yourself or your family down    Trouble concentrating on things, such as reading the newspaper or watching television    Moving or speaking so slowly that other people could have noticed.  Or the opposite - being so fidgety or restless that you have been moving around a lot more than usual    Thoughts that you would be better off dead, or hurting yourself in some way    PHQ2-9 Total Score    If you  checked off any problems, how difficult have these problems made it for you to do your work, take care of things at home, or get along with other people    Depression Interventions/Treatment        01/10/2018    4:00 PM  GAD 7 : Generalized Anxiety Score  Nervous, Anxious, on Edge 3   Control/stop worrying 2   Worry too much - different things 3   Trouble relaxing 3   Restless 2   Easily annoyed or irritable 3   Afraid - awful might happen 3   Total GAD 7 Score 19     Data saved with a previous flowsheet row definition   Attestations:   Patient is establishing care in this system with me as PCP. Available records reviewed. Chart updated today with reconciliation of problem list, medications, allergies, and relevant history. Preventive care and chronic disease status reviewed.  Outside labs reviewed and will be abstracted. Portions of historical chart may  remain incomplete; will update on an ongoing basis as clinically indicated.   Geni Shutter, DO, MS (Nutrition), FAAFP, Dipl. ABOM Fellow, American Academy of Family Physicians Diplomate, American Board of Obesity Medicine [facility] [address] [place], [place] [address] Dept: [phone] Fax: [phone]     [1]  Outpatient Medications Prior to Visit  Medication Sig   benazepril  (LOTENSIN ) 40 MG tablet TAKE 1 TABLET BY MOUTH EVERY DAY   cholecalciferol (VITAMIN D3) 25 MCG (1000 UT) tablet Take 1,000 Units by mouth daily.   levofloxacin  (LEVAQUIN ) 500 MG tablet Take 1 tablet (500 mg total) by mouth daily.   No facility-administered medications prior to visit.  [2]  Allergies Allergen Reactions   Amlodipine     Dizzy, palpitations   Fentanyl  Other (See Comments)   Other Other (See Comments)   Penicillins Other (See Comments)    Has patient had a PCN reaction causing immediate rash, facial/tongue/throat swelling, SOB or lightheadedness with hypotension: Unknown Has patient had a PCN reaction causing severe rash involving mucus membranes or skin necrosis: No Has patient had a PCN reaction that required hospitalization: No Has patient had a PCN reaction occurring within the last 10 years: No If all of the above answers are NO, then may proceed with Cephalosporin use.    Albuterol Palpitations   Sulfa Antibiotics Rash   "

## 2024-10-04 ENCOUNTER — Ambulatory Visit: Admitting: Family Medicine

## 2024-10-19 ENCOUNTER — Encounter: Admitting: Nurse Practitioner

## 2024-10-25 ENCOUNTER — Ambulatory Visit: Admitting: Dermatology

## 2024-11-06 ENCOUNTER — Ambulatory Visit: Admitting: Family Medicine

## 2024-11-30 ENCOUNTER — Ambulatory Visit: Admitting: Family Medicine

## 2024-12-24 ENCOUNTER — Ambulatory Visit: Admitting: Physician Assistant

## 2025-05-03 ENCOUNTER — Ambulatory Visit

## 2025-06-18 ENCOUNTER — Ambulatory Visit: Admitting: Dermatology
